# Patient Record
Sex: Male | Born: 1975 | Race: White | Hispanic: No | Marital: Married | Smoking: Never smoker
Health system: Southern US, Community
[De-identification: ages and names within clinical notes are randomized; demographics above are authoritative.]

## PROBLEM LIST (undated history)

## (undated) DIAGNOSIS — T7840XA Allergy, unspecified, initial encounter: Secondary | ICD-10-CM

## (undated) DIAGNOSIS — U071 COVID-19: Secondary | ICD-10-CM

## (undated) DIAGNOSIS — E785 Hyperlipidemia, unspecified: Secondary | ICD-10-CM

## (undated) DIAGNOSIS — K219 Gastro-esophageal reflux disease without esophagitis: Secondary | ICD-10-CM

## (undated) DIAGNOSIS — M1990 Unspecified osteoarthritis, unspecified site: Secondary | ICD-10-CM

## (undated) DIAGNOSIS — N200 Calculus of kidney: Secondary | ICD-10-CM

## (undated) DIAGNOSIS — F419 Anxiety disorder, unspecified: Secondary | ICD-10-CM

## (undated) DIAGNOSIS — I10 Essential (primary) hypertension: Secondary | ICD-10-CM

## (undated) HISTORY — DX: Unspecified osteoarthritis, unspecified site: M19.90

## (undated) HISTORY — PX: ESOPHAGOGASTRODUODENOSCOPY: SHX1529

## (undated) HISTORY — DX: Hyperlipidemia, unspecified: E78.5

## (undated) HISTORY — DX: Calculus of kidney: N20.0

## (undated) HISTORY — DX: Allergy, unspecified, initial encounter: T78.40XA

## (undated) HISTORY — PX: COLONOSCOPY: SHX174

## (undated) HISTORY — PX: SHOULDER SURGERY: SHX246

## (undated) HISTORY — DX: Anxiety disorder, unspecified: F41.9

## (undated) HISTORY — DX: COVID-19: U07.1

## (undated) HISTORY — DX: Gastro-esophageal reflux disease without esophagitis: K21.9

---

## 2005-06-23 ENCOUNTER — Emergency Department: Payer: Self-pay | Admitting: Emergency Medicine

## 2005-06-23 ENCOUNTER — Other Ambulatory Visit: Payer: Self-pay

## 2013-04-13 DIAGNOSIS — C439 Malignant melanoma of skin, unspecified: Secondary | ICD-10-CM

## 2013-04-13 HISTORY — DX: Malignant melanoma of skin, unspecified: C43.9

## 2014-12-18 ENCOUNTER — Ambulatory Visit: Payer: Self-pay | Admitting: Surgery

## 2015-01-03 ENCOUNTER — Ambulatory Visit: Payer: Self-pay | Admitting: Surgery

## 2015-01-03 DIAGNOSIS — I10 Essential (primary) hypertension: Secondary | ICD-10-CM

## 2015-02-17 NOTE — Op Note (Signed)
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   PROCEDURES: Arthroscopic superior labral tear from anterior to posterior repair, arthroscopic subacromial decompression, and mini-open biceps tenodesis of right shoulder.   SURGEON:   Pascal Lux, MD  ANESTHESIA:  General endotracheal with an interscalene block placed preoperative by the anesthesiologist.   FINDINGS: As noted above. The labral tear extended from the 10:30 to the 1 o'clock position. There was mild tendinopathic changes of the biceps tendon. The rotator cuff itself was in excellent condition. There were at most grade 1 chondromalacia changes involving the glenoid. The  articular surface of the humeral head appeared to be in good shape.   COMPLICATIONS:  None.   ESTIMATED BLOOD LOSS: Minimal.   TOTAL FLUIDS: Crystalloid 1000 mL.   TOURNIQUET: None.   DRAINS: None.   CLOSURE:  Staples.   BRIEF CLINICAL NOTE: The patient is a 39 year old male with a several month history of persistent right shoulder pain. His symptoms have persisted despite medications, activity modification, etc.  His history and examination were consistent with impingement/tendinopathy with a probable SLAP tear. SLAP tear was confirmed by MRI scan. He presents at this time for arthroscopy, SLAP repair, decompression, and a probable biceps tenodesis.   PROCEDURE IN DETAIL: The patient underwent placement of an interscalene block in the preoperative holding area. He was then brought into the operating room and lain in the supine position. After adequate general endotracheal intubation and anesthesia were obtained, the patient was repositioned in the beach  chair position using the beach chair positioner.  The right shoulder and upper extremity were prepped with ChloraPrep solution before being draped sterilely. Preoperative antibiotics were administered. The expected portal sites and incision site were injected 0.5% Sensorcaine with epinephrine before the camera was placed in the posterior portal. The glenohumeral joint was thoroughly inspected with the findings as described above. An anterior portal was created using an outside in technique. The labrum was carefully probed, confirming the above noted findings. The frayed margins of the torn portion of the labrum were debrided with a full-radius resector. The exposed glenoid rim was roughened with a liberator and the full radius resector down to bleeding bone. The biceps tendon was released from its labral attachment using the ArthroCare wand. The labrum was repaired using a single BioKnotless anchor placed at approximately the 12 o'clock position through a superolateral portal created using an outside in technique. Subsequent probing of the  repair demonstrated excellent stability. The instruments were removed from the joint after suctioning the excess fluid.   The camera was repositioned through the posterior portal into the subacromial space. A separate lateral portal was created using an outside in technique. The shaver was used to remove   abundant bursal tissues before the ArthroCare wand was used to remove the periosteum off the undersurface of the anterior third of the acromion as well as to recess the coracoacromial ligament. The  decompression was completed by performing an acromioplasty using a 4 mm acromionizer bur. The undersurface of the anterior 3rd of the acromion was removed.  The 3.5 full radius resector was reintroduced to remove any residual bony debris before the ArthroCare wand was inserted to obtain hemostasis. The instruments were removed from the subacromial space after suctioning the excess  fluid.   An approximately 4-5 cm incision was made over the anterolateral aspect of the shoulder, incorporating the superolateral portal site and extending distally in line with the bicipital groove. The incision was carried down through the subcutaneous tissues to expose the deltoid fascia. The raphe between the anterior and middle thirds was developed to provide access into the subacromial space. Additional bursal tissues were debrided before the bicipital groove was identified by palpation. The sheath was opened and the biceps tendon delivered through this defect into the wound. The floor of the bicipital groove was roughened with a curette before a single 2.9 mm Biomet JuggerKnot anchor was inserted. Both sets of sutures were passed through the tendon and tied securely to effect the tenodesis. Several additional number #0 Ethibond interrupted sutures were used to close the bicipital sheath, incorporating the biceps tendon to further reinforce the tenodesis. The rotator cuff was carefully inspected on its bursal surface and found to be intact.   The wound was copiously irrigated with sterile saline solution before the deltoid raphe was reapproximated using 2-0 Vicryl interrupted sutures. The subcutaneous tissues were closed in two layers using 2-0 Vicryl interrupted sutures before the skin was closed using staples. The portal sites also were closed using staples. A sterile bulky dressing was applied to the shoulder before the arm was placed into a shoulder immobilizer. The patient was then awakened, extubated, and returned to the recovery room in satisfactory condition after tolerating the procedure well.     ____________________________ J. Dorien Chihuahua, MD jjp:tr D: 01/03/2015 11:12:05 ET T: 01/03/2015 13:06:22 ET JOB#: [id number]  cc: Pascal Lux, MD, <Dictator> Pascal Lux MD ELECTRONICALLY SIGNED 01/08/2015 13:16

## 2016-04-07 ENCOUNTER — Other Ambulatory Visit: Payer: Self-pay | Admitting: Surgery

## 2016-04-07 DIAGNOSIS — M7541 Impingement syndrome of right shoulder: Secondary | ICD-10-CM

## 2016-04-07 DIAGNOSIS — S43431D Superior glenoid labrum lesion of right shoulder, subsequent encounter: Secondary | ICD-10-CM

## 2016-04-07 DIAGNOSIS — M7501 Adhesive capsulitis of right shoulder: Secondary | ICD-10-CM

## 2016-04-07 DIAGNOSIS — M65811 Other synovitis and tenosynovitis, right shoulder: Secondary | ICD-10-CM

## 2016-04-16 ENCOUNTER — Telehealth: Payer: Self-pay | Admitting: Surgery

## 2016-04-16 NOTE — Telephone Encounter (Signed)
Mailed patient a letter to call centralized scheduling to schedule MRI arthrogram. Phoned patient 3x  and left messages with no response

## 2016-11-14 ENCOUNTER — Other Ambulatory Visit
Admission: RE | Admit: 2016-11-14 | Discharge: 2016-11-14 | Disposition: A | Discharge status: Home / Self Care | Payer: BLUE CROSS/BLUE SHIELD | Source: Ambulatory Visit | Attending: Student | Admitting: Student

## 2016-11-14 DIAGNOSIS — R197 Diarrhea, unspecified: Secondary | ICD-10-CM | POA: Diagnosis not present

## 2016-11-14 LAB — GASTROINTESTINAL PANEL BY PCR, STOOL (REPLACES STOOL CULTURE)
Adenovirus F40/41: NOT DETECTED
Astrovirus: NOT DETECTED
CYCLOSPORA CAYETANENSIS: NOT DETECTED
Campylobacter species: NOT DETECTED
Cryptosporidium: NOT DETECTED
Entamoeba histolytica: NOT DETECTED
Enteroaggregative E coli (EAEC): NOT DETECTED
Enteropathogenic E coli (EPEC): NOT DETECTED
Enterotoxigenic E coli (ETEC): NOT DETECTED
Giardia lamblia: NOT DETECTED
Norovirus GI/GII: NOT DETECTED
Plesimonas shigelloides: NOT DETECTED
Rotavirus A: NOT DETECTED
SALMONELLA SPECIES: NOT DETECTED
SHIGA LIKE TOXIN PRODUCING E COLI (STEC): NOT DETECTED
SHIGELLA/ENTEROINVASIVE E COLI (EIEC): NOT DETECTED
Sapovirus (I, II, IV, and V): NOT DETECTED
VIBRIO CHOLERAE: NOT DETECTED
VIBRIO SPECIES: NOT DETECTED
Yersinia enterocolitica: NOT DETECTED

## 2016-11-14 LAB — C DIFFICILE QUICK SCREEN W PCR REFLEX
C DIFFICILE (CDIFF) INTERP: NOT DETECTED
C DIFFICILE (CDIFF) TOXIN: NEGATIVE
C Diff antigen: NEGATIVE

## 2016-11-17 LAB — PANCREATIC ELASTASE, FECAL: Pancreatic Elastase-1, Stool: >500 ug Elast./g (ref >200)

## 2017-05-04 ENCOUNTER — Other Ambulatory Visit: Payer: Self-pay | Admitting: Student

## 2017-05-04 DIAGNOSIS — R1013 Epigastric pain: Secondary | ICD-10-CM

## 2017-05-04 DIAGNOSIS — R1012 Left upper quadrant pain: Secondary | ICD-10-CM

## 2017-05-06 ENCOUNTER — Other Ambulatory Visit: Payer: Self-pay | Admitting: Student

## 2017-05-06 DIAGNOSIS — D509 Iron deficiency anemia, unspecified: Secondary | ICD-10-CM

## 2017-05-06 DIAGNOSIS — R1013 Epigastric pain: Secondary | ICD-10-CM

## 2017-05-06 DIAGNOSIS — R1012 Left upper quadrant pain: Secondary | ICD-10-CM

## 2017-05-12 ENCOUNTER — Ambulatory Visit
Admission: RE | Admit: 2017-05-12 | Discharge: 2017-05-12 | Disposition: A | Discharge status: Home / Self Care | Payer: BLUE CROSS/BLUE SHIELD | Source: Ambulatory Visit | Attending: Student | Admitting: Student

## 2017-05-12 ENCOUNTER — Ambulatory Visit: Payer: BLUE CROSS/BLUE SHIELD

## 2017-05-12 DIAGNOSIS — R1012 Left upper quadrant pain: Secondary | ICD-10-CM | POA: Insufficient documentation

## 2017-05-12 DIAGNOSIS — R1013 Epigastric pain: Secondary | ICD-10-CM | POA: Insufficient documentation

## 2017-05-12 DIAGNOSIS — D509 Iron deficiency anemia, unspecified: Secondary | ICD-10-CM

## 2017-05-12 DIAGNOSIS — D649 Anemia, unspecified: Secondary | ICD-10-CM | POA: Diagnosis not present

## 2017-05-12 HISTORY — DX: Essential (primary) hypertension: I10

## 2017-05-12 MED ORDER — IOPAMIDOL (ISOVUE-300) INJECTION 61%
100.0000 mL | Freq: Once | INTRAVENOUS | Status: AC | PRN
  Administered 2017-05-12: 100 mL via INTRAVENOUS

## 2017-05-14 ENCOUNTER — Ambulatory Visit: Payer: BLUE CROSS/BLUE SHIELD

## 2017-08-20 IMAGING — CT CT ABD-PELV W/ CM
2 of 5 series · 17 of 46 positions shown, 19 images · IV contrast (iopamidol)
Comparison: None.

CLINICAL DATA: Left upper quadrant pain and burning sensation for 1
year. History of prior gastritis.

EXAM:
CT ABDOMEN AND PELVIS WITH CONTRAST
TECHNIQUE: Multidetector CT imaging of the abdomen and pelvis was performed
using the standard protocol following bolus administration of
intravenous contrast.
CONTRAST:  100mL 7UVAU6-IFF IOPAMIDOL (7UVAU6-IFF) INJECTION 61%

[Series 2: axial soft tissue · axial · 0.73mm/px · z∈[-906,-456]mm · 14 of 102 slices shown, 16 images]
[im 6/102  soft-tissue]
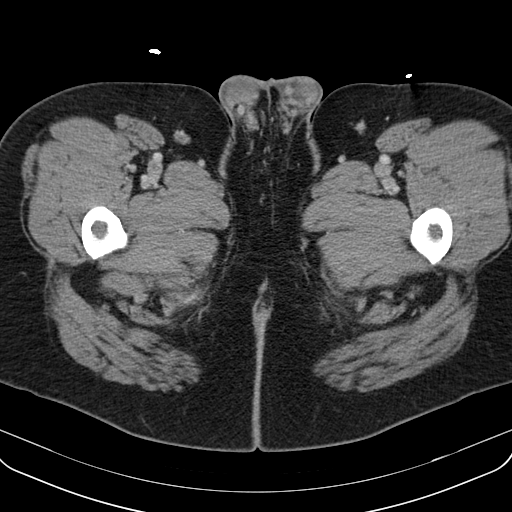
[im 6/102  bone]
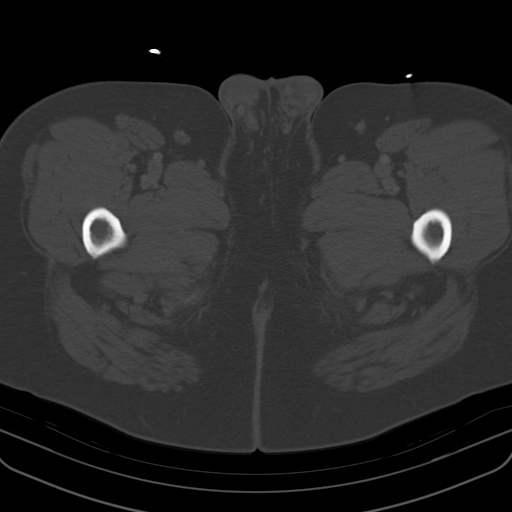
[im 12/102  soft-tissue]
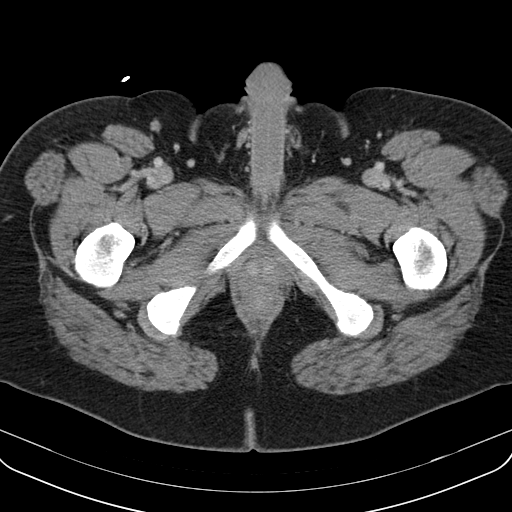
[im 23/102  soft-tissue]
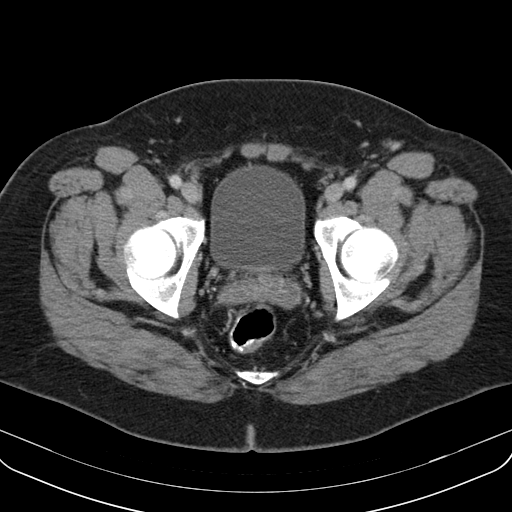
[im 29/102  soft-tissue]
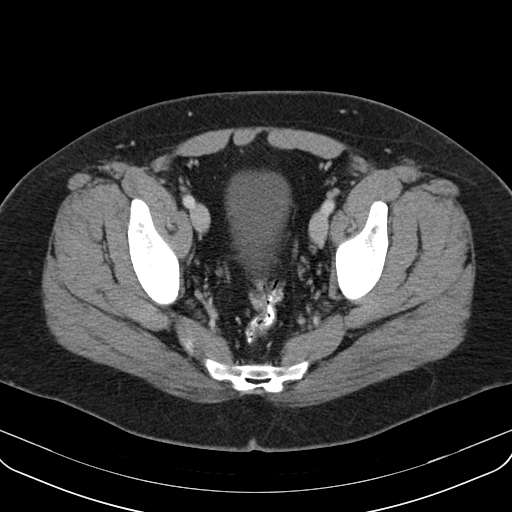
[im 34/102  soft-tissue]
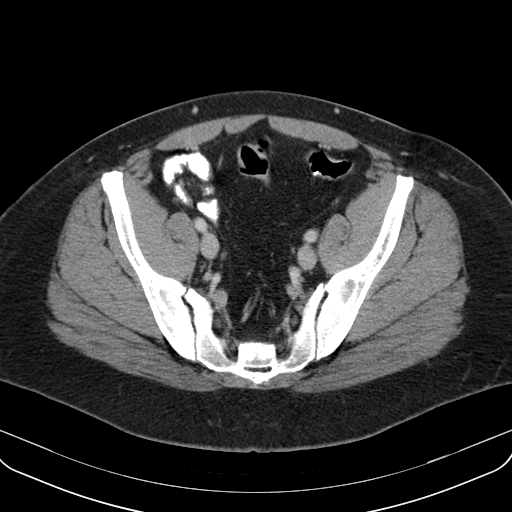
[im 40/102  soft-tissue]
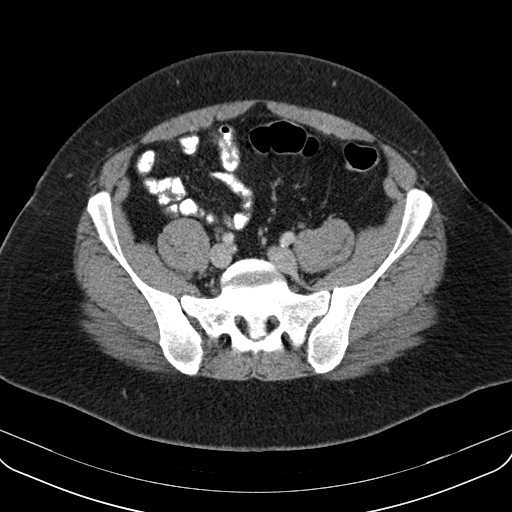
[im 45/102  soft-tissue]
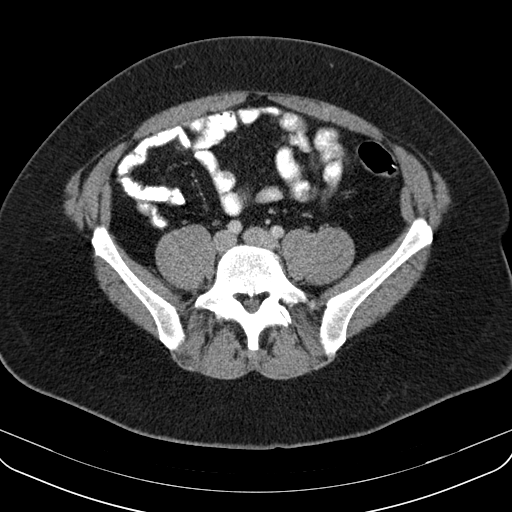
[im 57/102  soft-tissue]
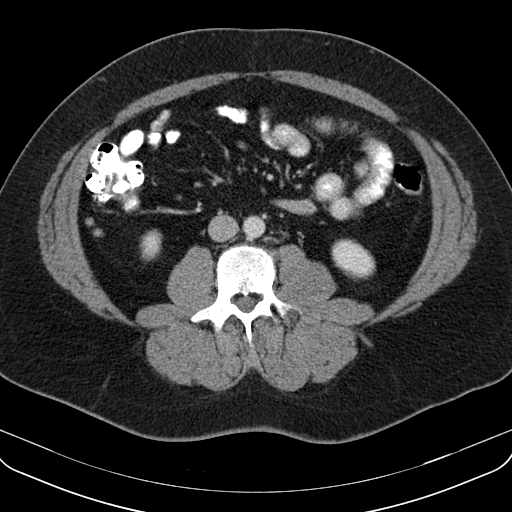
[im 62/102  soft-tissue]
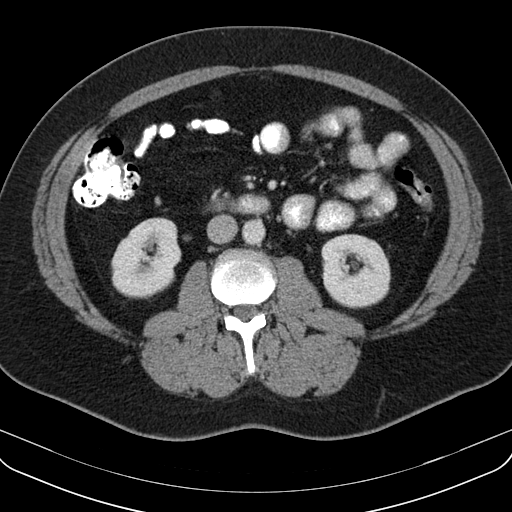
[im 62/102  bone]
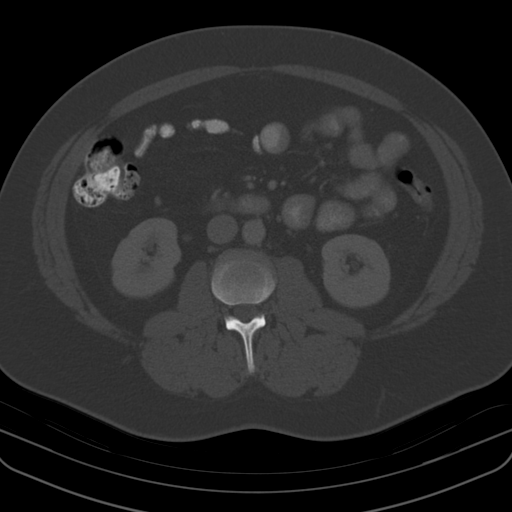
[im 68/102  soft-tissue]
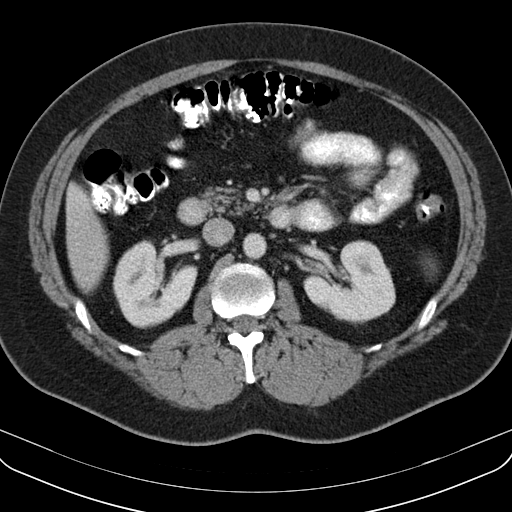
[im 73/102  soft-tissue]
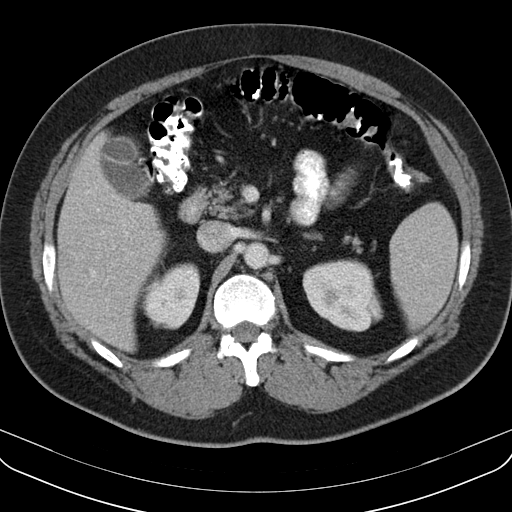
[im 79/102  soft-tissue]
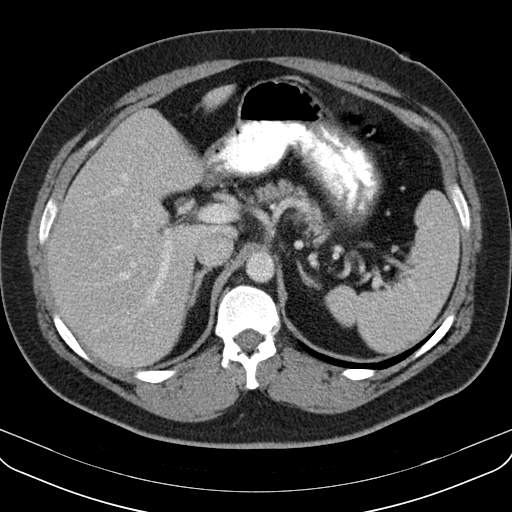
[im 90/102  soft-tissue]
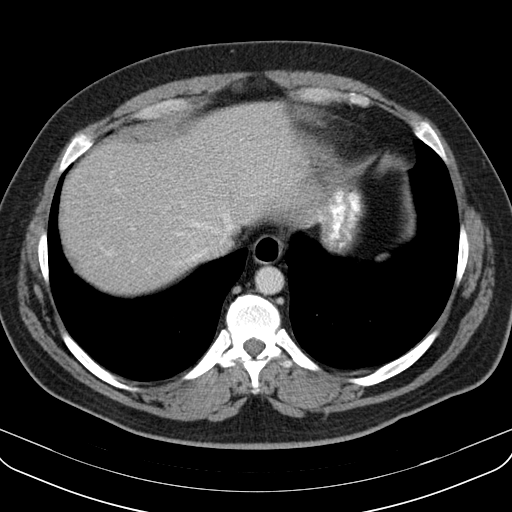
[im 96/102  soft-tissue]
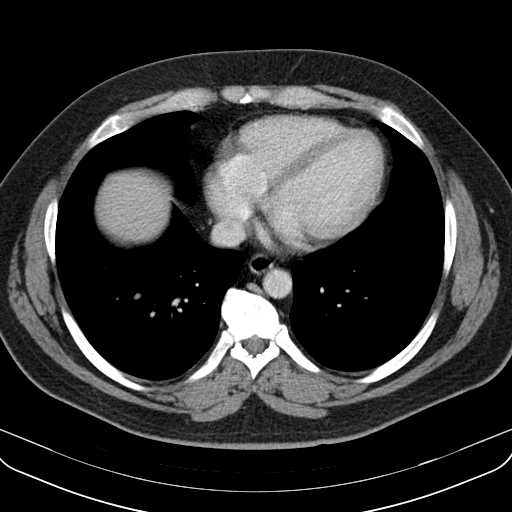

[Series 602: coronal · coronal · 1.00mm/px · 3 of 136 slices shown]
[im 46/136  soft-tissue]
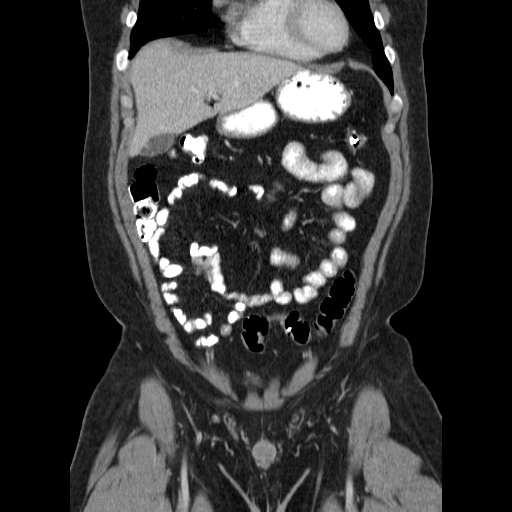
[im 61/136  soft-tissue]
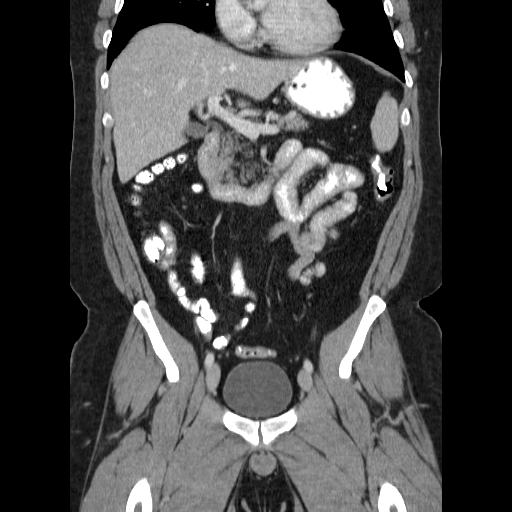
[im 76/136  soft-tissue]
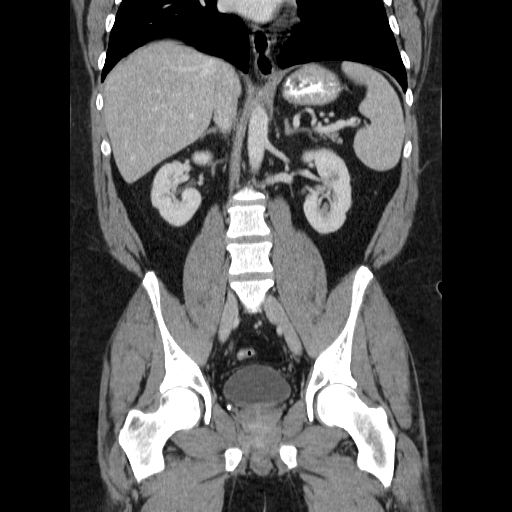

[17 of 46 positions shown; findings below may reference images not displayed]

FINDINGS: Lower chest: The lung bases are clear.  The heart is normal in size.

Hepatobiliary: No focal liver abnormality is seen. No gallstones,
gallbladder wall thickening, or biliary dilatation.

Pancreas: Unremarkable. No pancreatic ductal dilatation or
surrounding inflammatory changes.

Spleen: Normal in size without focal abnormality.

Adrenals/Urinary Tract: Adrenal glands are unremarkable. Kidneys are
normal, without renal calculi, focal lesion, or hydronephrosis.
Bladder is unremarkable.

Stomach/Bowel: Stomach is within normal limits. Appendix appears
normal. No evidence of bowel wall thickening, distention, or
inflammatory changes.

Vascular/Lymphatic: No significant vascular findings are present. No
enlarged abdominal or pelvic lymph nodes.

Reproductive: Prostate is unremarkable.

Other: No abdominal wall hernia or abnormality. No abdominopelvic
ascites.

Musculoskeletal: No acute or significant osseous findings.
IMPRESSION: Normal CT of the abdomen and pelvis. No finding to explain the
patient's symptoms.

## 2019-09-20 ENCOUNTER — Other Ambulatory Visit: Payer: Self-pay

## 2019-09-20 DIAGNOSIS — Z20822 Contact with and (suspected) exposure to covid-19: Secondary | ICD-10-CM

## 2019-09-23 LAB — NOVEL CORONAVIRUS, NAA: SARS-CoV-2, NAA: NOT DETECTED

## 2020-05-30 ENCOUNTER — Other Ambulatory Visit: Payer: Self-pay | Admitting: Physician Assistant

## 2020-05-30 DIAGNOSIS — M5414 Radiculopathy, thoracic region: Secondary | ICD-10-CM

## 2020-06-18 ENCOUNTER — Ambulatory Visit: Payer: BC Managed Care – PPO

## 2020-07-04 ENCOUNTER — Ambulatory Visit: Payer: BC Managed Care – PPO

## 2020-09-30 ENCOUNTER — Other Ambulatory Visit: Payer: Self-pay

## 2020-09-30 ENCOUNTER — Encounter: Payer: Self-pay | Admitting: Dermatology

## 2020-09-30 ENCOUNTER — Ambulatory Visit (INDEPENDENT_AMBULATORY_CARE_PROVIDER_SITE_OTHER): Payer: BC Managed Care – PPO | Admitting: Dermatology

## 2020-09-30 DIAGNOSIS — D1800 Hemangioma unspecified site: Secondary | ICD-10-CM

## 2020-09-30 DIAGNOSIS — D229 Melanocytic nevi, unspecified: Secondary | ICD-10-CM

## 2020-09-30 DIAGNOSIS — Z1283 Encounter for screening for malignant neoplasm of skin: Secondary | ICD-10-CM

## 2020-09-30 DIAGNOSIS — L814 Other melanin hyperpigmentation: Secondary | ICD-10-CM | POA: Diagnosis not present

## 2020-09-30 DIAGNOSIS — L820 Inflamed seborrheic keratosis: Secondary | ICD-10-CM

## 2020-09-30 DIAGNOSIS — D225 Melanocytic nevi of trunk: Secondary | ICD-10-CM | POA: Diagnosis not present

## 2020-09-30 DIAGNOSIS — L821 Other seborrheic keratosis: Secondary | ICD-10-CM | POA: Diagnosis not present

## 2020-09-30 DIAGNOSIS — D485 Neoplasm of uncertain behavior of skin: Secondary | ICD-10-CM

## 2020-09-30 DIAGNOSIS — Z86006 Personal history of melanoma in-situ: Secondary | ICD-10-CM

## 2020-09-30 DIAGNOSIS — L578 Other skin changes due to chronic exposure to nonionizing radiation: Secondary | ICD-10-CM

## 2020-09-30 NOTE — Progress Notes (Signed)
Follow-Up Visit   Subjective  Stephen Cochran is a 44 y.o. 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 The patient presents for Total-Body Skin Exam (TBSE) for skin cancer screening and mole check.  The following portions of the chart were reviewed this encounter and updated as appropriate:   Allergies  Meds  Problems  Med Hx  Surg Hx  Fam Hx     Review of Systems:  No other skin or systemic complaints except as noted in HPI or Assessment and Plan.  Objective  Well appearing patient in no apparent distress; mood and affect are within normal limits.  A full examination was performed including scalp, head, eyes, ears, nose, lips, neck, chest, axillae, abdomen, back, buttocks, bilateral upper extremities, bilateral lower extremities, hands, feet, fingers, toes, fingernails, and toenails. All findings within normal limits unless otherwise noted below.  Objective  L med cheek: 2.0 cm Erythematous keratotic or waxy stuck-on papule or plaque.   Objective  L mid back 8.0 cm lat to spine: 0.8 cm irregular brown macule  Assessment & Plan  Inflamed seborrheic keratosis L med cheek  Destruction of lesion - L med cheek Complexity: simple   Destruction method: cryotherapy   Informed consent: discussed and consent obtained   Timeout:  patient name, date of birth, surgical site, and procedure verified Lesion destroyed using liquid nitrogen: Yes   Region frozen until ice ball extended beyond lesion: Yes   Outcome: patient tolerated procedure well with no complications   Post-procedure details: wound care instructions given    Neoplasm of uncertain behavior of skin L mid back 8.0 cm lat to spine  Epidermal / dermal shaving  Lesion diameter (cm):  0.8 Informed consent: discussed and consent obtained   Timeout: patient name, date of birth, surgical site, and procedure verified   Procedure prep:  Patient was prepped and draped in usual  sterile fashion Prep type:  Isopropyl alcohol Anesthesia: the lesion was anesthetized in a standard fashion   Anesthetic:  1% lidocaine w/ epinephrine 1-100,000 buffered w/ 8.4% NaHCO3 Instrument used: flexible razor blade   Hemostasis achieved with: pressure, aluminum chloride and electrodesiccation   Outcome: patient tolerated procedure well   Post-procedure details: sterile dressing applied and wound care instructions given   Dressing type: bandage and petrolatum    Specimen 1 - Surgical pathology Differential Diagnosis: D48.5 r/o dysplastic nevus  Hx MMIS Check Margins: No 0.8 cm irregular brown macule   Lentigines - Scattered tan macules - Discussed due to sun exposure - Benign, observe - Call for any changes  Seborrheic Keratoses - Stuck-on, waxy, tan-brown papules and plaques  - Discussed benign etiology and prognosis. - Observe - Call for any changes  Melanocytic Nevi - Tan-brown and/or pink-flesh-colored symmetric macules and papules - Benign appearing on exam today - Observation - Call clinic for new or changing moles - Recommend daily use of broad spectrum spf 30+ sunscreen to sun-exposed areas.   Hemangiomas - Red papules - Discussed benign nature - Observe - Call for any changes  Actinic Damage - Chronic, secondary to cumulative UV/sun exposure - diffuse scaly erythematous macules with underlying dyspigmentation - Recommend daily broad spectrum sunscreen SPF 30+ to sun-exposed areas, reapply every 2 hours as needed.  - Call for new or changing lesions.  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  - Call if any new or changing lesions are noted between office visits  Skin cancer screening performed today.  Return in about 3 months  (around 12/29/2020) for recheck ISK .  I, Rudell Cobb, CMA, am acting as scribe for Sarina Ser, MD .  Documentation: I have reviewed the above documentation for accuracy and completeness, and I agree with the above.  Sarina Ser, MD

## 2020-09-30 NOTE — Patient Instructions (Signed)

## 2020-10-02 ENCOUNTER — Encounter: Payer: Self-pay | Admitting: Dermatology

## 2020-10-02 ENCOUNTER — Telehealth: Payer: Self-pay

## 2020-10-02 NOTE — Telephone Encounter (Signed)
-----   Message from Ralene Bathe, MD sent at 10/01/2020  7:10 PM EST ----- Diagnosis Skin , left mid back 0.8 cm lat to spine MELANOCYTIC NEVUS, INTRADERMAL TYPE  Benign mole No further treatment needed

## 2020-10-02 NOTE — Telephone Encounter (Signed)
Patient informed of pathology results 

## 2021-01-13 ENCOUNTER — Ambulatory Visit: Payer: BC Managed Care – PPO | Admitting: Dermatology

## 2021-02-26 ENCOUNTER — Ambulatory Visit: Payer: BC Managed Care – PPO | Admitting: Dermatology

## 2021-02-27 ENCOUNTER — Ambulatory Visit: Payer: BC Managed Care – PPO | Admitting: Dermatology

## 2021-04-24 ENCOUNTER — Telehealth: Payer: Self-pay | Admitting: Psychiatry

## 2021-04-28 ENCOUNTER — Ambulatory Visit: Payer: Self-pay | Admitting: Psychiatry

## 2021-07-16 ENCOUNTER — Other Ambulatory Visit: Payer: Self-pay

## 2021-07-16 ENCOUNTER — Encounter: Payer: Self-pay | Admitting: Emergency Medicine

## 2021-07-16 ENCOUNTER — Ambulatory Visit
Admission: EM | Admit: 2021-07-16 | Discharge: 2021-07-16 | Disposition: A | Discharge status: Home / Self Care | Payer: BC Managed Care – PPO | Attending: Physician Assistant | Admitting: Physician Assistant

## 2021-07-16 DIAGNOSIS — I10 Essential (primary) hypertension: Secondary | ICD-10-CM | POA: Diagnosis not present

## 2021-07-16 DIAGNOSIS — B353 Tinea pedis: Secondary | ICD-10-CM | POA: Diagnosis not present

## 2021-07-16 DIAGNOSIS — L089 Local infection of the skin and subcutaneous tissue, unspecified: Secondary | ICD-10-CM | POA: Diagnosis not present

## 2021-07-16 MED ORDER — CICLOPIROX OLAMINE 0.77 % EX CREA: TOPICAL_CREAM | Freq: Two times a day (BID) | CUTANEOUS | 0 refills | Status: DC

## 2021-07-16 MED ORDER — CEPHALEXIN 500 MG PO CAPS: 500.0000 mg | ORAL_CAPSULE | Freq: Three times a day (TID) | ORAL | 0 refills | Status: DC

## 2021-07-16 NOTE — ED Provider Notes (Signed)
[facility]    CSN: [id number] Arrival date & time: 07/16/21  1000      History   Chief Complaint Chief Complaint  Patient presents with   Foot Pain    HPI Stephen Cochran is a 45 y.o. male.   Patient presents today with a 1 month history of painful rash on right foot. He reports symptoms began as a pruritic and irritating rash. He treated this with numerous over the counter medications for athlete's foot that improved rash but did not resolve it. Reports increasing pain with persistent rash between right second and third toes. Pain is rated 7 on a 0-10 pain scale, described as burning with periodic sharp pains, worse with working and being on his feet all day at work. He has tried multiple medications without improvement of symptoms. Denies history of diabetes. He has developed fever, and chills but denies additional symptoms. Denies any recent antibiotic use. Denies history of dermatological condition. Has not seen anyone about this condition. He is having trouble with daily activities as a result of symptoms.   Blood pressure was noted to be elevated today. Patient believes this is related to pain. He is taking medication as prescribed. Denies any headache, chest pain, shortness of breath, vision changes, dizziness.    Past Medical History:  Diagnosis Date   Hypertension    Melanoma ([place]) 04/13/2013   L mid upper back - excision     There are no problems to display for this patient.   Past Surgical History:  Procedure Laterality Date   SHOULDER SURGERY         Home Medications    Prior to Admission medications   Medication Sig Start Date End Date Taking? Authorizing Provider  cephALEXin (KEFLEX) 500 MG capsule Take 1 capsule (500 mg total) by mouth 3 (three) times daily. 07/16/21  Yes Aunisty Reali K, PA-C  ciclopirox (LOPROX) 0.77 % cream Apply topically 2 (two) times daily. 07/16/21  Yes Glendell Fouse K, PA-C  atenolol (TENORMIN) 100 MG tablet Take  by mouth. 03/23/19   [provider]  lamoTRIgine (LAMICTAL) 100 MG tablet Take 100 mg by mouth daily. 09/25/20   [provider]  losartan (COZAAR) 100 MG tablet Take 100 mg by mouth daily. 07/17/20   [provider]  pantoprazole (PROTONIX) 40 MG tablet Take 1 tablet by mouth daily. 05/28/20   [provider]    Family History History reviewed. No pertinent family history.  Social History Social History   Tobacco Use   Smoking status: Never   Smokeless tobacco: Current  Vaping Use   Vaping Use: Never used  Substance Use Topics   Alcohol use: Never   Drug use: Never     Allergies   Patient has no known allergies.   Review of Systems Review of Systems  Constitutional:  Positive for activity change, chills and fever. Negative for appetite change and fatigue.  Respiratory:  Negative for cough and shortness of breath.   Gastrointestinal:  Negative for abdominal pain, diarrhea, nausea and vomiting.  Musculoskeletal:  Negative for arthralgias and myalgias.  Skin:  Positive for color change, rash and wound.  Neurological:  Negative for dizziness, weakness, light-headedness, numbness and headaches.    Physical Exam Triage Vital Signs ED Triage Vitals  Enc Vitals Group     BP 07/16/21 1010 (!) 160/87     Pulse Rate 07/16/21 1010 71     Resp 07/16/21 1010 18     Temp 07/16/21  1010 98.2 F (36.8 C)     Temp Source 07/16/21 1010 Oral     SpO2 07/16/21 1010 98 %     Weight --      Height --      Head Circumference --      Peak Flow --      Pain Score 07/16/21 1018 7     Pain Loc --      Pain Edu? --      Excl. in [place]? --    No data found.  Updated Vital Signs BP (!) 160/87 (BP Location: Left Arm)   Pulse 71   Temp 98.2 F (36.8 C) (Oral)   Resp 18   SpO2 98%   Visual Acuity Right Eye Distance:   Left Eye Distance:   Bilateral Distance:    Right Eye Near:   Left Eye Near:    Bilateral Near:     Physical Exam Vitals  reviewed.  Constitutional:      General: He is awake.     Appearance: Normal appearance. He is well-developed. He is not ill-appearing.     Comments: Very pleasant male appears stated age in no acute distress  HENT:     Head: Normocephalic and atraumatic.     Mouth/Throat:     Pharynx: No oropharyngeal exudate, posterior oropharyngeal erythema or uvula swelling.  Cardiovascular:     Rate and Rhythm: Normal rate and regular rhythm.     Pulses:          Posterior tibial pulses are 2+ on the right side and 2+ on the left side.     Heart sounds: Normal heart sounds, S1 normal and S2 normal. No murmur heard.    Comments: Capillary refill within 2 seconds right foot Pulmonary:     Effort: Pulmonary effort is normal.     Breath sounds: Normal breath sounds. No stridor. No wheezing, rhonchi or rales.     Comments: Clear to auscultation bilaterally Abdominal:     General: Bowel sounds are normal.     Palpations: Abdomen is soft.     Tenderness: There is no abdominal tenderness.  Feet:     Right foot:     Protective Sensation: 10 sites tested.  10 sites sensed.  Skin:    Findings: Rash present. Rash is macular and papular.     Comments: Maculopapular rash with associated laceration and fissures noted at 2/3 Toes.  Serosanguineous drainage noted.  No purulent drainage noted.  No streaking or evidence of lymphangitis.  Neurological:     Mental Status: He is alert.  Psychiatric:        Behavior: Behavior is cooperative.     UC Treatments / Results  Labs (all labs ordered are listed, but only abnormal results are displayed) Labs Reviewed - No data to display  EKG   Radiology No results found.  Procedures Procedures (including critical care time)  Medications Ordered in UC Medications - No data to display  Initial Impression / Assessment and Plan / UC Course  I have reviewed the triage vital signs and the nursing notes.  Pertinent labs & imaging results that were available  during my care of the patient were reviewed by me and considered in my medical decision making (see chart for details).      Symptoms consistent with tinea pedis but concern given patient has not had improvement of symptoms despite consistent use of over-the-counter antifungal medications for over a week.  I suspect this secondary  infection and given associated fever/chills and worsening symptoms.  We will start antibiotics (Keflex 500 mg for 7 days) as well as use Loprox to treat underlying tinea pedis.  Patient was encouraged to keep area clean and dry.  He can use over-the-counter medications such as Tylenol for pain relief.  Discussed alarm symptoms that warrant emergent evaluation.  He was given contact information for local podiatrist and encouraged to follow-up with them if symptoms or not improving.  Strict return precautions given to which he expressed understanding.  Blood pressure was noted to be elevated today.  Patient denies any signs/symptoms of endorgan damage.  He was encouraged to monitor his blood pressure at home and keep log for evaluation of follow-up appointment.  If this is persistently above 140/90 he needs to be reevaluated.  Discussed that if he has any chest pain, shortness of breath, headache, vision changes, dizziness in setting of high blood pressure he needs to go to emergency room.  Recommended he follow-up with primary care provider in a week to ensure symptom improvement.  Final Clinical Impressions(s) / UC Diagnoses   Final diagnoses:  Skin infection  Tinea pedis of right foot  Elevated blood pressure reading in office with diagnosis of hypertension     Discharge Instructions      Take antibiotics as prescribed.  Use Loprox twice daily for minimum of 2 weeks.  Keep area clean and dry.  If you have persistent symptoms please follow-up with podiatrist.  If anything worsens please return for reevaluation.  Blood pressure was elevated today.  Please continue  monitoring this.  If this remains elevated or if you develop any symptoms including headache, chest pain, shortness of breath in the setting of high blood pressure you need to be seen immediately.  Follow-up with PCP within a week to ensure symptom improvement.     ED Prescriptions     Medication Sig Dispense Auth. Provider   cephALEXin (KEFLEX) 500 MG capsule Take 1 capsule (500 mg total) by mouth 3 (three) times daily. 21 capsule Taven Strite K, PA-C   ciclopirox (LOPROX) 0.77 % cream Apply topically 2 (two) times daily. 60 g Shemuel Harkleroad, Derry Skill, PA-C      PDMP not reviewed this encounter.   Terrilee Croak, PA-C 07/16/21 1048

## 2021-07-16 NOTE — ED Triage Notes (Addendum)
Complains of right foot pain.  Reports athletes foot issues for a month and has used otc medicines and no improvement.  Patient reports this is getting much worse, painful.  Patient spends long shifts at work in work boots.  Raw and irritated skin between middle and toe next to great toe

## 2021-07-16 NOTE — Discharge Instructions (Signed)
Take antibiotics as prescribed.  Use Loprox twice daily for minimum of 2 weeks.  Keep area clean and dry.  If you have persistent symptoms please follow-up with podiatrist.  If anything worsens please return for reevaluation.  Blood pressure was elevated today.  Please continue monitoring this.  If this remains elevated or if you develop any symptoms including headache, chest pain, shortness of breath in the setting of high blood pressure you need to be seen immediately.  Follow-up with PCP within a week to ensure symptom improvement.

## 2021-08-21 ENCOUNTER — Ambulatory Visit (INDEPENDENT_AMBULATORY_CARE_PROVIDER_SITE_OTHER): Payer: BC Managed Care – PPO | Admitting: Pulmonary Disease

## 2021-08-21 ENCOUNTER — Encounter: Payer: Self-pay | Admitting: Pulmonary Disease

## 2021-08-21 ENCOUNTER — Other Ambulatory Visit: Payer: Self-pay

## 2021-08-21 VITALS — BP 100/60 | HR 61 | Temp 98.4°F | Ht 60.0 in | Wt 231.6 lb

## 2021-08-21 DIAGNOSIS — J4550 Severe persistent asthma, uncomplicated: Secondary | ICD-10-CM | POA: Diagnosis not present

## 2021-08-21 DIAGNOSIS — R0602 Shortness of breath: Secondary | ICD-10-CM

## 2021-08-21 DIAGNOSIS — Z8616 Personal history of COVID-19: Secondary | ICD-10-CM

## 2021-08-21 MED ORDER — ARNUITY ELLIPTA 100 MCG/ACT IN AEPB: 1.0000 | INHALATION_SPRAY | Freq: Every day | RESPIRATORY_TRACT | 5 refills | Status: DC

## 2021-08-21 MED ORDER — ALBUTEROL SULFATE HFA 108 (90 BASE) MCG/ACT IN AERS: 2.0000 | INHALATION_SPRAY | Freq: Four times a day (QID) | RESPIRATORY_TRACT | 5 refills | Status: AC | PRN

## 2021-08-21 NOTE — Patient Instructions (Signed)
Arnuity one puff daily, and rinse mouth after each use  Albuterol two puffs every 6 hours as needed for cough, wheeze, chest congestion, or shortness of breath  Will get [facility] of medical records from [facility]  Will arrange for pulmonary function test, echocardiogram, and overnight oxygen test  Follow up in 4 to 6 weeks with Dr. Halford Chessman or Nurse Practitioner

## 2021-08-21 NOTE — Progress Notes (Signed)
[facility]  Chief Complaint  Patient presents with   Consult    Post Covid 19-  dyspnea.    Past Surgical History:  He  has a past surgical history that includes Shoulder surgery; Colonoscopy; and Esophagogastroduodenoscopy.  Past Medical History:  HTN, Melanoma 2014, Allergies, Anxiety, OA, GERD, HLD, Nephrolithiasis, COVID 19   Constitutional:  BP 100/60 (BP Location: Left Arm, Patient Position: Sitting, Cuff Size: Normal)   Pulse 61   Temp 98.4 F (36.9 C) (Oral)   Ht 5' (1.524 m)   Wt 231 lb 9.6 oz (105.1 kg)   SpO2 100%   BMI 45.23 kg/m   Brief Summary:  Stephen Cochran is a 45 y.o. male with dyspnea.      Subjective:   He is here with his wife who is a Marine scientist.  He started feeling ill on 06/23/21.  He developed fever on 06/24/21, and tested positive for COVID 19.  He was treated with paxlovid.  Since then he has noticed difficulty with his breathing.  He will get a dry cough and wheeze.  When he feels short of breath he gets anxious and then light-headed.  He has trouble will allergies, especially in the Spring.  He intermittently gets wheezing in his chest, but was never diagnosed with asthma.  No history of food or medication allergies.  He never smoked.  He thinks he had pneumonia several years ago, but wasn't formally diagnosed with this.  He denies eczema or hives.  He works as a Clinical cytogeneticist for [employer], and his work is very strenuous.  He was having difficulty keeping up with work requirements after he had COVID, and his PCP put him on duty restriction.  He has not had leg swelling, or hemoptysis.  He intermittently feels tight in his chest.  He maintained SpO2 in 90s on room air while walking today, but had to stop after walking two laps due to feeling short of breath.  Physical Exam:   Appearance - well kempt   ENMT - no sinus tenderness, no oral exudate, no LAN, Mallampati 3 airway, no stridor, deviated  septum  Respiratory - equal breath sounds bilaterally, no wheezing or rales  CV - s1s2 regular rate and rhythm, no murmurs  Ext - no clubbing, no edema  Skin - no rashes  Psych - normal mood and affect  ECG today showed sinus bradycardia with heart rate of 59.   Pulmonary testing:    Chest Imaging:    Sleep Tests:    Cardiac Tests:    Social History:  He  reports that he has never smoked. He uses smokeless tobacco. He reports that he does not drink alcohol and does not use drugs.  Family History:  His family history includes Cancer in his mother; Colon cancer in his father; Coronary artery disease in his father; Hypertension in his father.    Discussion:  He likely has asthma in the setting of allergic rhinitis.  He has developed worsening asthma like symptoms since being diagnosed with COVID 19 infection in September 2022.  He also reports variable blood pressure, light-headedness, and dizzy spells.  He is also experiencing brain fog and fatigue.  His sense of smell and taste are slowly returning.  Assessment/Plan:   Dyspnea. - will have him try arnuity 100 one puff daily and prn albuterol - will get copy of recent lab tests and chest xray from his PCP office - will arrange for pulmonary function  test, echocardiogram, and overnight oximetry - it is my recommendation that he remain on restricted work duty until his response to inhaler therapy can be assessed and above test results reviewed  Time Spent Involved in Patient Care on Day of Examination:  65 minutes  Follow up:   Patient Instructions  Arnuity one puff daily, and rinse mouth after each use  Albuterol two puffs every 6 hours as needed for cough, wheeze, chest congestion, or shortness of breath  Will get copy of medical records from [facility]  Will arrange for pulmonary function test, echocardiogram, and overnight oxygen test  Follow up in 4 to 6 weeks with Dr. Halford Chessman or Nurse  Practitioner  Medication List:   Allergies as of 08/21/2021   No Known Allergies      Medication List        Accurate as of August 21, 2021 10:37 AM. If you have any questions, ask your nurse or doctor.          STOP taking these medications    cephALEXin 500 MG capsule Commonly known as: KEFLEX Stopped by: Chesley Mires, MD       TAKE these medications    albuterol 108 (90 Base) MCG/ACT inhaler Commonly known as: VENTOLIN HFA Inhale 2 puffs into the lungs every 6 (six) hours as needed for wheezing or shortness of breath. Started by: Chesley Mires, MD   Arnuity Ellipta 100 MCG/ACT Aepb Generic drug: Fluticasone Furoate Inhale 1 puff into the lungs daily. Started by: Chesley Mires, MD   atenolol 100 MG tablet Commonly known as: TENORMIN Take by mouth.   ciclopirox 0.77 % cream Commonly known as: LOPROX Apply topically 2 (two) times daily. What changed: Another medication with the same name was removed. Continue taking this medication, and follow the directions you see here. Changed by: Chesley Mires, MD   lamoTRIgine 100 MG tablet Commonly known as: LAMICTAL Take 100 mg by mouth daily.   losartan 100 MG tablet Commonly known as: COZAAR Take 100 mg by mouth daily.   pantoprazole 40 MG tablet Commonly known as: PROTONIX Take 1 tablet by mouth daily.        Signature:  Chesley Mires, MD [place] Pager - [phone] 08/21/2021, 10:37 AM

## 2021-08-26 ENCOUNTER — Other Ambulatory Visit: Payer: Self-pay

## 2021-08-26 ENCOUNTER — Other Ambulatory Visit
Admission: RE | Admit: 2021-08-26 | Discharge: 2021-08-26 | Disposition: A | Discharge status: Home / Self Care | Payer: BC Managed Care – PPO | Source: Ambulatory Visit | Attending: Pulmonary Disease | Admitting: Pulmonary Disease

## 2021-08-26 DIAGNOSIS — Z01812 Encounter for preprocedural laboratory examination: Secondary | ICD-10-CM | POA: Diagnosis not present

## 2021-08-26 DIAGNOSIS — Z20822 Contact with and (suspected) exposure to covid-19: Secondary | ICD-10-CM | POA: Diagnosis not present

## 2021-08-26 LAB — SARS CORONAVIRUS 2 (TAT 6-24 HRS): SARS Coronavirus 2: NEGATIVE

## 2021-08-27 ENCOUNTER — Ambulatory Visit (HOSPITAL_BASED_OUTPATIENT_CLINIC_OR_DEPARTMENT_OTHER)
Admission: RE | Admit: 2021-08-27 | Discharge: 2021-08-27 | Disposition: A | Discharge status: Home / Self Care | Payer: BC Managed Care – PPO | Source: Ambulatory Visit | Attending: Pulmonary Disease | Admitting: Pulmonary Disease

## 2021-08-27 ENCOUNTER — Other Ambulatory Visit: Payer: Self-pay

## 2021-08-27 ENCOUNTER — Ambulatory Visit: Payer: BC Managed Care – PPO

## 2021-08-27 ENCOUNTER — Emergency Department
Admission: EM | Admit: 2021-08-27 | Discharge: 2021-08-27 | Disposition: A | Discharge status: Home / Self Care | Payer: BC Managed Care – PPO | Attending: Emergency Medicine | Admitting: Emergency Medicine

## 2021-08-27 DIAGNOSIS — R0600 Dyspnea, unspecified: Secondary | ICD-10-CM | POA: Insufficient documentation

## 2021-08-27 DIAGNOSIS — R531 Weakness: Secondary | ICD-10-CM | POA: Insufficient documentation

## 2021-08-27 DIAGNOSIS — J4550 Severe persistent asthma, uncomplicated: Secondary | ICD-10-CM | POA: Insufficient documentation

## 2021-08-27 DIAGNOSIS — R002 Palpitations: Secondary | ICD-10-CM | POA: Diagnosis present

## 2021-08-27 DIAGNOSIS — Z85820 Personal history of malignant melanoma of skin: Secondary | ICD-10-CM | POA: Diagnosis not present

## 2021-08-27 DIAGNOSIS — F17220 Nicotine dependence, chewing tobacco, uncomplicated: Secondary | ICD-10-CM | POA: Diagnosis not present

## 2021-08-27 DIAGNOSIS — I10 Essential (primary) hypertension: Secondary | ICD-10-CM | POA: Diagnosis not present

## 2021-08-27 DIAGNOSIS — E785 Hyperlipidemia, unspecified: Secondary | ICD-10-CM | POA: Diagnosis not present

## 2021-08-27 DIAGNOSIS — R0602 Shortness of breath: Secondary | ICD-10-CM

## 2021-08-27 DIAGNOSIS — Z79899 Other long term (current) drug therapy: Secondary | ICD-10-CM | POA: Insufficient documentation

## 2021-08-27 DIAGNOSIS — Z8616 Personal history of COVID-19: Secondary | ICD-10-CM

## 2021-08-27 LAB — TROPONIN I (HIGH SENSITIVITY)
Troponin I (High Sensitivity): 3 ng/L (ref <18)
Troponin I (High Sensitivity): 3 ng/L (ref <18)

## 2021-08-27 LAB — BASIC METABOLIC PANEL
Anion gap: 8 (ref 5–15)
BUN: 14 mg/dL (ref 6–20)
CO2: 28 mmol/L (ref 22–32)
Calcium: 9.6 mg/dL (ref 8.9–10.3)
Chloride: 101 mmol/L (ref 98–111)
Creatinine, Ser: 0.95 mg/dL (ref 0.61–1.24)
GFR, Estimated: >60 mL/min (ref >60)
Glucose, Bld: 111 mg/dL — ABNORMAL HIGH (ref 70–99)
Potassium: 4.5 mmol/L (ref 3.5–5.1)
Sodium: 137 mmol/L (ref 135–145)

## 2021-08-27 LAB — CBC
HCT: 43 % (ref 39.0–52.0)
Hemoglobin: 14.3 g/dL (ref 13.0–17.0)
MCH: 28.8 pg (ref 26.0–34.0)
MCHC: 33.3 g/dL (ref 30.0–36.0)
MCV: 86.7 fL (ref 80.0–100.0)
Platelets: 307 10*3/uL (ref 150–400)
RBC: 4.96 MIL/uL (ref 4.22–5.81)
RDW: 13.6 % (ref 11.5–15.5)
WBC: 10.2 10*3/uL (ref 4.0–10.5)
nRBC: 0 % (ref 0.0–0.2)

## 2021-08-27 LAB — ECHOCARDIOGRAM COMPLETE
AR max vel: 2.75 cm2
AV Area VTI: 2.95 cm2
AV Area mean vel: 2.6 cm2
AV Mean grad: 3 mmHg
AV Peak grad: 4.8 mmHg
Ao pk vel: 1.1 m/s
Area-P 1/2: 4.19 cm2
MV VTI: 2 cm2
S' Lateral: 3.11 cm

## 2021-08-27 MED ORDER — ACETAMINOPHEN 500 MG PO TABS
1000.0000 mg | ORAL_TABLET | Freq: Once | ORAL | Status: AC
  Administered 2021-08-27: 1000 mg via ORAL
  Filled 2021-08-27: qty 2

## 2021-08-27 NOTE — ED Provider Notes (Signed)
[facility] Emergency Department Provider Note  Time seen: 11:52 AM  I have reviewed the triage vital signs and the nursing notes.   HISTORY  Chief Complaint Palpitations   HPI Stephen Cochran is a 45 y.o. male with a past medical history of anxiety, gastric reflux, hypertension, hyperlipidemia, presents to the emergency department for palpitations.  According to the patient in September he was diagnosed with COVID-19.  Since that time he has had significant episodes of dizziness weakness and shortness of breath that has been ongoing over the past 2 months.  Patient was referred to pulmonology for the symptoms, today during an echocardiogram he appeared to have heart rate ranging between 50 and 200 bpm.  There were concerned so they sent the patient to the emergency department for evaluation.  Here the patient states mild dizziness at times but denies any shortness of breath.  Patient denies any chest pain at any point.  Patient's heart rate appears to be in a normal sinus rhythm around 60 bpm.   Past Medical History:  Diagnosis Date   Allergies    Anxiety    Arthritis    COVID-19    GERD (gastroesophageal reflux disease)    Hyperlipidemia    Hypertension    Melanoma ([place]) 04/13/2013   L mid upper back - excision    Nephrolithiasis     There are no problems to display for this patient.   Past Surgical History:  Procedure Laterality Date   COLONOSCOPY     ESOPHAGOGASTRODUODENOSCOPY     SHOULDER SURGERY      Prior to Admission medications   Medication Sig Start Date End Date Taking? Authorizing Provider  albuterol (VENTOLIN HFA) 108 (90 Base) MCG/ACT inhaler Inhale 2 puffs into the lungs every 6 (six) hours as needed for wheezing or shortness of breath. 08/21/21   Chesley Mires, MD  atenolol (TENORMIN) 100 MG tablet Take by mouth. 03/23/19   [provider]  ciclopirox (LOPROX) 0.77 % cream Apply topically 2 (two) times daily.    [provider]  Fluticasone Furoate (ARNUITY ELLIPTA) 100 MCG/ACT AEPB Inhale 1 puff into the lungs daily. 08/21/21   Chesley Mires, MD  lamoTRIgine (LAMICTAL) 100 MG tablet Take 100 mg by mouth daily. 09/25/20   [provider]  losartan (COZAAR) 100 MG tablet Take 100 mg by mouth daily. 07/17/20   [provider]  pantoprazole (PROTONIX) 40 MG tablet Take 1 tablet by mouth daily. 05/28/20   [provider]    No Known Allergies  Family History  Problem Relation Age of Onset   Cancer Mother    Hypertension Father    Colon cancer Father    Coronary artery disease Father     Social History Social History   Tobacco Use   Smoking status: Never   Smokeless tobacco: Current  Vaping Use   Vaping Use: Never used  Substance Use Topics   Alcohol use: Never   Drug use: Never    Review of Systems Constitutional: Negative for fever.  Intermittent dizziness/weakness. Cardiovascular: Negative for chest pain.  Positive for palpitations, heart rate as high as 200 earlier today during echocardiogram Respiratory: Intermittent episodes of shortness of breath.  States that at times have occurred multiple times per day lasting 10 to 15 minutes, other times it will be several days in between symptoms. Gastrointestinal: Negative for abdominal pain, vomiting Genitourinary: Negative for urinary compaints Musculoskeletal: Negative for musculoskeletal complaints Neurological: Negative for headache All other  ROS negative  ____________________________________________   PHYSICAL EXAM:  VITAL SIGNS: ED Triage Vitals  Enc Vitals Group     BP 08/27/21 1113 (!) 150/60     Pulse Rate 08/27/21 1113 (!) 58     Resp 08/27/21 1113 20     Temp 08/27/21 1146 97.9 F (36.6 C)     Temp src --      SpO2 08/27/21 1113 100 %     Weight 08/27/21 1111 233 lb 11 oz (106 kg)     Height 08/27/21 1111 5' (1.524 m)     Head Circumference --      Peak Flow --      Pain Score 08/27/21 1111  0     Pain Loc --      Pain Edu? --      Excl. in [place]? --     Constitutional: Alert and oriented. Well appearing and in no distress. Eyes: Normal exam ENT      Head: Normocephalic and atraumatic.      Mouth/Throat: Mucous membranes are moist. Cardiovascular: Normal rate, regular rhythm.  Respiratory: Normal respiratory effort without tachypnea nor retractions. Breath sounds are clear without wheeze rales or rhonchi Gastrointestinal: Soft and nontender. No distention.   Musculoskeletal: Nontender with normal range of motion in all extremities.  Neurologic:  Normal speech and language. No gross focal neurologic deficits Skin:  Skin is warm, dry and intact.  Psychiatric: Mood and affect are normal.   ____________________________________________    EKG  EKG viewed and interpreted by myself shows a normal sinus rhythm at 56 bpm with a narrow QRS, normal axis, normal intervals, no concerning ST changes.  ____________________________________________    INITIAL IMPRESSION / ASSESSMENT AND PLAN / ED COURSE  Pertinent labs & imaging results that were available during my care of the patient were reviewed by me and considered in my medical decision making (see chart for details).   Patient presents emergency department for intermittent episodes of dizziness weakness shortness of breath ongoing since he was diagnosed with COVID in September.  Patient was seen by pulmonology earlier today had an echocardiogram in which his heart rate ranged from 50 to 200 bpm.  They were concerned so they sent patient to the emergency department for evaluation.  Here the patient appears well, no chest pain at any point.  No current shortness of breath.  Does state intermittent dizziness.  Heart rate appears to be around 60 bpm normal sinus rhythm we will continue to watch on the cardiac monitor and obtain lab work including cardiac enzymes.  Patient agreeable plan of care.  Patient's work-up is reassuring.   Negative troponin x2.  Reassuring EKG.  Patient has been on a cardiac monitor since arrival to the emergency department with reassuring rhythm strips.  No arrhythmias noted.  We will discharge her cardiology follow-up for likely Holter monitoring.  Stephen Cochran was evaluated in Emergency Department on 08/27/2021 for the symptoms described in the history of present illness. He was evaluated in the context of the global COVID-19 pandemic, which necessitated consideration that the patient might be at risk for infection with the SARS-CoV-2 virus that causes COVID-19. Institutional protocols and algorithms that pertain to the evaluation of patients at risk for COVID-19 are in a state of rapid change based on information released by regulatory bodies including the CDC and federal and state organizations. These policies and algorithms were followed during the patient's care in the ED.  ____________________________________________   FINAL CLINICAL IMPRESSION(S) /  ED DIAGNOSES  Palpitations weakness   Harvest Dark, MD 08/27/21 1435

## 2021-08-27 NOTE — Progress Notes (Signed)
*  PRELIMINARY RESULTS* Echocardiogram 2D Echocardiogram has been performed.  Stephen Cochran 08/27/2021, 10:47 AM

## 2021-08-27 NOTE — ED Notes (Addendum)
Pt having dizziness, generalized weakness, lethargy and SOB. SOB and generalized weakness/dizziness since early September when pt had Covid. Pt has also had headaches since September and worsened yesterday. Pt having dry cough, esp. at night. Daughter sick with Covid recently. Hx: HTN on Atenolol and Losartan- reports has been difficult to manage sometimes and runs high at baseline at least 932 to 671 systolic. Pt denies chest pain but reports slight headache. Reports fluctuations found in HR on echo.

## 2021-08-27 NOTE — Discharge Instructions (Signed)
Please call the number provided for cardiology to arrange a follow-up appointment for further evaluation and consideration of Holter monitor testing.  Your work-up in the emergency department has resulted showing normal results, with normal pulse rates throughout your stay.  Return to the emergency department if you develop any chest pain, trouble breathing, or any other symptom personally concerning to yourself.

## 2021-08-27 NOTE — ED Triage Notes (Signed)
Pt sent from echo for HR ranging 40-200. Pt reports dizziness and overall not feeling well since covid in sept.  NAD noted Denies cardiac hx Denies chest pain

## 2021-08-28 ENCOUNTER — Telehealth: Payer: Self-pay | Admitting: Pulmonary Disease

## 2021-08-28 NOTE — Telephone Encounter (Signed)
Patient dropped off medical evaluation form in office. Emailed to [employer] for review.  Called patient who states the ER took him out of work starting today, 11/10 with no return to work date as of now. Patient is scheduled to see his cardiologist today. Patient needs continuous leave.

## 2021-08-29 ENCOUNTER — Other Ambulatory Visit: Payer: Self-pay

## 2021-08-29 DIAGNOSIS — Z8616 Personal history of COVID-19: Secondary | ICD-10-CM

## 2021-08-29 DIAGNOSIS — R0602 Shortness of breath: Secondary | ICD-10-CM

## 2021-08-29 DIAGNOSIS — J4550 Severe persistent asthma, uncomplicated: Secondary | ICD-10-CM

## 2021-09-04 NOTE — Telephone Encounter (Signed)
Spoke with patient's wife and informed her we are working on the forms. Emailed Patrice to check the status- informed patient's wife we should be able to complete forms by end of day tomorrow.   Dr. Halford Chessman is in [place] this week so we will have to fax forms for signature then fax to [facility] for patient to pickup.

## 2021-09-05 NOTE — Telephone Encounter (Signed)
Forms have been sent to [facility] for Dr. Juanetta Gosling signature. Per Patrice--Dr. Halford Chessman wrote a note for light duty - according to his job description it doesn't look like he has the option for light duty so I have done continuous leave as requested by the patient - this may need to be explained to Dr. Halford Chessman or signing provider."

## 2021-09-05 NOTE — Telephone Encounter (Signed)
Forms signed- faxed to [facility] for patient pickup. Spoke with patient who states he will pick up on Monday morning. Informed of $29 form fee.

## 2021-09-18 ENCOUNTER — Encounter: Payer: Self-pay | Admitting: Pulmonary Disease

## 2021-09-19 ENCOUNTER — Telehealth: Payer: Self-pay

## 2021-09-19 NOTE — Telephone Encounter (Signed)
Created in error

## 2021-09-19 NOTE — Telephone Encounter (Signed)
Dr Halford Chessman Please advise:  Stephen Cochran is now stating I need to have all working restrictions taken off my papers so that it doesn't seem that I am on light duty restrictions. I'm sorry for such craziness.  [name]

## 2021-09-19 NOTE — Telephone Encounter (Signed)
Pt returned phone call. Patient aware of upcoming COVID test. Nothing further needed.

## 2021-09-19 NOTE — Telephone Encounter (Signed)
Called and LVM in regards to upcoming covid test on 12/5. Will try again later. Routing to [employer] triage

## 2021-09-22 ENCOUNTER — Other Ambulatory Visit
Admission: RE | Admit: 2021-09-22 | Discharge: 2021-09-22 | Disposition: A | Discharge status: Home / Self Care | Payer: BC Managed Care – PPO | Source: Ambulatory Visit | Attending: Pulmonary Disease | Admitting: Pulmonary Disease

## 2021-09-22 ENCOUNTER — Other Ambulatory Visit: Payer: Self-pay

## 2021-09-22 DIAGNOSIS — Z01812 Encounter for preprocedural laboratory examination: Secondary | ICD-10-CM | POA: Diagnosis present

## 2021-09-22 DIAGNOSIS — Z20822 Contact with and (suspected) exposure to covid-19: Secondary | ICD-10-CM | POA: Insufficient documentation

## 2021-09-23 ENCOUNTER — Ambulatory Visit: Payer: BC Managed Care – PPO | Attending: Pulmonary Disease

## 2021-09-23 DIAGNOSIS — Z8616 Personal history of COVID-19: Secondary | ICD-10-CM | POA: Insufficient documentation

## 2021-09-23 DIAGNOSIS — J4550 Severe persistent asthma, uncomplicated: Secondary | ICD-10-CM | POA: Diagnosis not present

## 2021-09-23 DIAGNOSIS — R0602 Shortness of breath: Secondary | ICD-10-CM | POA: Diagnosis present

## 2021-09-23 LAB — PULMONARY FUNCTION TEST ARMC ONLY
DL/VA % pred: 107 %
DL/VA: 4.87 ml/min/mmHg/L
DLCO unc % pred: 84 %
DLCO unc: 23.82 ml/min/mmHg
FEF 25-75 Post: 3.45 L/sec
FEF 25-75 Pre: 2.97 L/sec
FEF2575-%Change-Post: 16 %
FEF2575-%Pred-Post: 100 %
FEF2575-%Pred-Pre: 86 %
FEV1-%Change-Post: 4 %
FEV1-%Pred-Post: 77 %
FEV1-%Pred-Pre: 73 %
FEV1-Post: 2.91 L
FEV1-Pre: 2.78 L
FEV1FVC-%Change-Post: 4 %
FEV1FVC-%Pred-Pre: 104 %
FEV6-%Change-Post: 0 %
FEV6-%Pred-Post: 73 %
FEV6-%Pred-Pre: 73 %
FEV6-Post: 3.42 L
FEV6-Pre: 3.39 L
FEV6FVC-%Pred-Post: 103 %
FEV6FVC-%Pred-Pre: 103 %
FVC-%Change-Post: 0 %
FVC-%Pred-Post: 71 %
FVC-%Pred-Pre: 70 %
FVC-Post: 3.42 L
FVC-Pre: 3.39 L
Post FEV1/FVC ratio: 85 %
Post FEV6/FVC ratio: 100 %
Pre FEV1/FVC ratio: 82 %
Pre FEV6/FVC Ratio: 100 %
RV % pred: 115 %
RV: 2.15 L
TLC % pred: 85 %
TLC: 5.58 L

## 2021-09-23 LAB — SARS CORONAVIRUS 2 (TAT 6-24 HRS): SARS Coronavirus 2: NEGATIVE

## 2021-09-23 MED ORDER — ALBUTEROL SULFATE (2.5 MG/3ML) 0.083% IN NEBU
2.5000 mg | INHALATION_SOLUTION | Freq: Once | RESPIRATORY_TRACT | Status: AC
  Administered 2021-09-23: 2.5 mg via RESPIRATORY_TRACT
  Filled 2021-09-23: qty 3

## 2021-09-23 NOTE — Telephone Encounter (Signed)
Forms received via fax- emailed to [place]. Patient requesting return date of 10/20/2021 with no restrictions.

## 2021-09-24 NOTE — Telephone Encounter (Signed)
Forms sent back for VS's signature.

## 2021-09-26 NOTE — Telephone Encounter (Signed)
VS was not able to sign when he was in office, Rexene Edison, NP reviewed forms and signed. Faxed to insurance and copy will be ready for patient at his appointment on 12/13 in [place]. Left voicemail informing patient.

## 2021-09-30 ENCOUNTER — Ambulatory Visit
Admission: RE | Admit: 2021-09-30 | Discharge: 2021-09-30 | Disposition: A | Discharge status: Home / Self Care | Payer: BC Managed Care – PPO | Source: Ambulatory Visit | Attending: Adult Health | Admitting: Adult Health

## 2021-09-30 ENCOUNTER — Ambulatory Visit (INDEPENDENT_AMBULATORY_CARE_PROVIDER_SITE_OTHER): Payer: BC Managed Care – PPO | Admitting: Adult Health

## 2021-09-30 ENCOUNTER — Encounter: Payer: Self-pay | Admitting: Adult Health

## 2021-09-30 ENCOUNTER — Other Ambulatory Visit: Payer: Self-pay

## 2021-09-30 VITALS — BP 122/80 | HR 66 | Temp 98.2°F | Ht 68.0 in | Wt 231.6 lb

## 2021-09-30 DIAGNOSIS — J45909 Unspecified asthma, uncomplicated: Secondary | ICD-10-CM | POA: Insufficient documentation

## 2021-09-30 DIAGNOSIS — R0602 Shortness of breath: Secondary | ICD-10-CM | POA: Diagnosis not present

## 2021-09-30 DIAGNOSIS — U099 Post covid-19 condition, unspecified: Secondary | ICD-10-CM | POA: Diagnosis not present

## 2021-09-30 DIAGNOSIS — R400 Somnolence: Secondary | ICD-10-CM | POA: Diagnosis not present

## 2021-09-30 DIAGNOSIS — G4719 Other hypersomnia: Secondary | ICD-10-CM | POA: Diagnosis not present

## 2021-09-30 DIAGNOSIS — K029 Dental caries, unspecified: Secondary | ICD-10-CM

## 2021-09-30 DIAGNOSIS — J4530 Mild persistent asthma, uncomplicated: Secondary | ICD-10-CM

## 2021-09-30 MED ORDER — FLUTICASONE FUROATE-VILANTEROL 100-25 MCG/ACT IN AEPB: 1.0000 | INHALATION_SPRAY | Freq: Every day | RESPIRATORY_TRACT | 0 refills | Status: DC

## 2021-09-30 NOTE — Progress Notes (Signed)
Reviewed and agree with assessment/plan.   Chesley Mires, MD [facility] 09/30/2021, 10:58 AM Pager:  [phone]

## 2021-09-30 NOTE — Assessment & Plan Note (Signed)
Patient has lingering long-haul COVID symptoms with dizziness, fatigue, activity intolerance, shortness of breath, cough.  Patient's had an extensive work-up with normal 2D echo, mild restriction on PFTs with no significant airflow obstruction, labs unrevealing. Patient is advised to advance activity as tolerated. We will treat for possible underlying asthmatic bronchitic exacerbation.  We will also increase asthma maintenance inhaler to dual therapy with ICS/LABA-Breo  Plan  Patient Instructions  Stop Arnuity .  Begin BREO 1 puff daily,  Augmentin 875mg  Twice daily  for 1 week . -take with food.  Prednisone 20mg  daily for 5 days-take with food.  Delsym 2 tsp Twice daily for cough As needed   Begin Zyrtec 10mg  At bedtime   Continue Protonix daily  Chest xray today   Set up for Home sleep study .   Set up dental follow up   Flu shot next week if feeling better.   Activity as tolerated.   Follow up with Dr. Halford Chessman or Mizani Dilday NP  in 4-6 weeks and As needed

## 2021-09-30 NOTE — Progress Notes (Signed)
@Patient  ID: Leodis Binet, male    DOB: [birth year], 45 y.o.   MRN: [id number]  Chief Complaint  Patient presents with   Follow-up    Referring provider: Practice, D[facility]*  HPI: 45 year old male never smoker seen for pulmonary consult September 17, 2021 for shortness of breath post COVID-19 infection Medical history significant for Bipolar disorder  TEST/EVENTS :  Chest xray 06/2021 report clear lungs   09/30/2021 Follow up: Dyspnea  Patient returns for a 6-week follow-up.  Patient was seen last visit for a pulmonary consult for shortness of breath after COVID-19 infection.  Patient was felt to have possible asthma component   He was started on Arnuity daily.  And given albuterol as needed. He was set up for a 2D echo that showed normal EF at 55 to 60%,  PFTs showed mild to moderate restriction with an FEV1 at 77%, ratio 85, FVC 71%, no significant bronchodilator response, normal DLCO 84% Prior to Covid 19 had no breathing issues or prior COPD/Asthma dx . Patient has lingering symptoms since covid with Dizziness, decreased activity tolerance, stamina , fatigue , memory issues . Has not been able to work.  Coughing up thick dark mucus , seems to be getting worse .   Wife is going through treatment for RA .   Complains of Restless sleep, snoring, wakes up unrefreshed. Feels he has not slept at all when he wakes up Goes to bed 7-8 pm , wakes up several times a night . Wakes up at 5 am .   Patient was seen by cardiology and had a exercise stress test.  Results are not available.  Patient says it was reported as okay but he had significant difficulties with dizziness and subsequently ended up in the emergency room.  He is planning on following back up with cardiology.  No Known Allergies   There is no immunization history on file for this patient.  Past Medical History:  Diagnosis Date   Allergies    Anxiety    Arthritis    COVID-19    GERD (gastroesophageal reflux  disease)    Hyperlipidemia    Hypertension    Melanoma ([place]) 04/13/2013   L mid upper back - excision    Nephrolithiasis     Tobacco History: Social History   Tobacco Use  Smoking Status Never  Smokeless Tobacco Current   Ready to quit: Not Answered Counseling given: Not Answered   Outpatient Medications Prior to Visit  Medication Sig Dispense Refill   albuterol (VENTOLIN HFA) 108 (90 Base) MCG/ACT inhaler Inhale 2 puffs into the lungs every 6 (six) hours as needed for wheezing or shortness of breath. 8 g 5   atenolol (TENORMIN) 100 MG tablet Take 100 mg by mouth daily.     ciclopirox (LOPROX) 0.77 % cream Apply topically 2 (two) times daily.     Fluticasone Furoate (ARNUITY ELLIPTA) 100 MCG/ACT AEPB Inhale 1 puff into the lungs daily. 30 each 5   lamoTRIgine (LAMICTAL) 100 MG tablet Take 100 mg by mouth daily.     losartan (COZAAR) 100 MG tablet Take 100 mg by mouth daily.     meloxicam (MOBIC) 7.5 MG tablet Take 7.5 mg by mouth daily.     Multiple Vitamins-Minerals (MULTIVITAMIN WITH MINERALS) tablet Take 1 tablet by mouth daily.     naltrexone (DEPADE) 50 MG tablet Take 100 mg by mouth daily.     pantoprazole (PROTONIX) 40 MG tablet Take 1 tablet by mouth daily.  No facility-administered medications prior to visit.     Review of Systems:   Constitutional:   No  weight loss, night sweats,  Fevers, chills,  +fatigue, or  lassitude.  HEENT:   No headaches,  Difficulty swallowing,  Tooth/dental problems, or  Sore throat,                No sneezing, itching, ear ache,  +nasal congestion, post nasal drip,   CV:  No chest pain,  Orthopnea, PND, swelling in lower extremities, anasarca, dizziness, palpitations, syncope.   GI  No heartburn, indigestion, abdominal pain, nausea, vomiting, diarrhea, change in bowel habits, loss of appetite, bloody stools.   Resp:  No chest wall deformity  Skin: no rash or lesions.  GU: no dysuria, change in color of urine, no urgency or  frequency.  No flank pain, no hematuria   MS:  No joint pain or swelling.  No decreased range of motion.  No back pain.    Physical Exam  BP 122/80 (BP Location: Left Arm, Patient Position: Sitting, Cuff Size: Normal)   Pulse 66   Temp 98.2 F (36.8 C) (Oral)   Ht 5\' 8"  (1.727 m)   Wt 231 lb 9.6 oz (105.1 kg)   SpO2 98%   BMI 35.21 kg/m   GEN: A/Ox3; pleasant , NAD, well nourished    HEENT:  [place]/AT,  Nose -clear, THROAT-clear, no lesions, no postnasal drip or exudate noted. Multiple dental caries and broken teeth . Class 2-3 MP airway   NECK:  Supple w/ fair ROM; no JVD; normal carotid impulses w/o bruits; no thyromegaly or nodules palpated; no lymphadenopathy.    RESP  Clear  P & A; w/o, wheezes/ rales/ or rhonchi. no accessory muscle use, no dullness to percussion  CARD:  RRR, no m/r/g, no peripheral edema, pulses intact, no cyanosis or clubbing.  GI:   Soft & nt; nml bowel sounds; no organomegaly or masses detected.   Musco: Warm bil, no deformities or joint swelling noted.   Neuro: alert, no focal deficits noted.    Skin: Warm, no lesions or rashes    Lab Results:  CBC    Component Value Date/Time   WBC 10.2 08/27/2021 1113   RBC 4.96 08/27/2021 1113   HGB 14.3 08/27/2021 1113   HCT 43.0 08/27/2021 1113   PLT 307 08/27/2021 1113   MCV 86.7 08/27/2021 1113   MCH 28.8 08/27/2021 1113   MCHC 33.3 08/27/2021 1113   RDW 13.6 08/27/2021 1113    BMET    Component Value Date/Time   NA 137 08/27/2021 1113   K 4.5 08/27/2021 1113   CL 101 08/27/2021 1113   CO2 28 08/27/2021 1113   GLUCOSE 111 (H) 08/27/2021 1113   BUN 14 08/27/2021 1113   CREATININE 0.95 08/27/2021 1113   CALCIUM 9.6 08/27/2021 1113   GFRNONAA >60 08/27/2021 1113    BNP No results found for: BNP  ProBNP No results found for: PROBNP  Imaging: No results found.  albuterol (PROVENTIL) (2.5 MG/3ML) 0.083% nebulizer solution 2.5 mg     Date Action Dose Route User   09/23/2021 1410  Given 2.5 mg Nebulization Dayton Martes, RT       PFT Results Latest Ref Rng & Units 09/23/2021  FVC-Pre L 3.39  FVC-Predicted Pre % 70  FVC-Post L 3.42  FVC-Predicted Post % 71  Pre FEV1/FVC % % 82  Post FEV1/FCV % % 85  FEV1-Pre L 2.78  FEV1-Predicted Pre % 73  FEV1-Post L 2.91  DLCO uncorrected ml/min/mmHg 23.82  DLCO UNC% % 84  DLVA Predicted % 107  TLC L 5.58  TLC % Predicted % 85  RV % Predicted % 115    No results found for: NITRICOXIDE      Assessment & Plan:   COVID-19 long hauler Patient has lingering long-haul COVID symptoms with dizziness, fatigue, activity intolerance, shortness of breath, cough.  Patient's had an extensive work-up with normal 2D echo, mild restriction on PFTs with no significant airflow obstruction, labs unrevealing. Patient is advised to advance activity as tolerated. We will treat for possible underlying asthmatic bronchitic exacerbation.  We will also increase asthma maintenance inhaler to dual therapy with ICS/LABA-Breo  Plan  Patient Instructions  Stop Arnuity .  Begin BREO 1 puff daily,  Augmentin 875mg  Twice daily  for 1 week . -take with food.  Prednisone 20mg  daily for 5 days-take with food.  Delsym 2 tsp Twice daily for cough As needed   Begin Zyrtec 10mg  At bedtime   Continue Protonix daily  Chest xray today   Set up for Home sleep study .   Set up dental follow up   Flu shot next week if feeling better.   Activity as tolerated.   Follow up with Dr. Halford Chessman or Jelisha Weed NP  in 4-6 weeks and As needed       Asthma Mild persistent asthma.  Suspect developed after COVID-19 infection.  We will change Arnuity to [facility]. Albuterol as needed  Plan  Patient Instructions  Stop Arnuity .  Begin BREO 1 puff daily,  Augmentin 875mg  Twice daily  for 1 week . -take with food.  Prednisone 20mg  daily for 5 days-take with food.  Delsym 2 tsp Twice daily for cough As needed   Begin Zyrtec 10mg  At bedtime   Continue Protonix  daily  Chest xray today   Set up for Home sleep study .   Set up dental follow up   Flu shot next week if feeling better.   Activity as tolerated.   Follow up with Dr. Halford Chessman or Verlinda Slotnick NP  in 4-6 weeks and As needed       Dental caries Patient has multiple dental caries.  Have recommended that he follow back up with his dentist.  Prior to [place] he said he was planning extensive dental work.  Would go forward with this plan.  Daytime sleepiness Patient has significant daytime sleepiness, fatigue, restless sleep, BMI 35 all suspicious for possible underlying sleep apnea.  Set up for home sleep study  Plan  Patient Instructions  Stop Arnuity .  Begin BREO 1 puff daily,  Augmentin 875mg  Twice daily  for 1 week . -take with food.  Prednisone 20mg  daily for 5 days-take with food.  Delsym 2 tsp Twice daily for cough As needed   Begin Zyrtec 10mg  At bedtime   Continue Protonix daily  Chest xray today   Set up for Home sleep study .   Set up dental follow up   Flu shot next week if feeling better.   Activity as tolerated.   Follow up with Dr. Halford Chessman or Alexi Dorminey NP  in 4-6 weeks and As needed         Rexene Edison, NP 09/30/2021

## 2021-09-30 NOTE — Assessment & Plan Note (Signed)
Patient has multiple dental caries.  Have recommended that he follow back up with his dentist.  Prior to [place] he said he was planning extensive dental work.  Would go forward with this plan.

## 2021-09-30 NOTE — Assessment & Plan Note (Signed)
Patient has significant daytime sleepiness, fatigue, restless sleep, BMI 35 all suspicious for possible underlying sleep apnea.  Set up for home sleep study  Plan  Patient Instructions  Stop Arnuity .  Begin BREO 1 puff daily,  Augmentin 875mg  Twice daily  for 1 week . -take with food.  Prednisone 20mg  daily for 5 days-take with food.  Delsym 2 tsp Twice daily for cough As needed   Begin Zyrtec 10mg  At bedtime   Continue Protonix daily  Chest xray today   Set up for Home sleep study .   Set up dental follow up   Flu shot next week if feeling better.   Activity as tolerated.   Follow up with Stephen Cochran or Stephen Lopez NP  in 4-6 weeks and As needed

## 2021-09-30 NOTE — Patient Instructions (Addendum)
Stop Arnuity .  Begin BREO 1 puff daily,  Augmentin 875mg  Twice daily  for 1 week . -take with food.  Prednisone 20mg  daily for 5 days-take with food.  Delsym 2 tsp Twice daily for cough As needed   Begin Zyrtec 10mg  At bedtime   Continue Protonix daily  Chest xray today   Set up for Home sleep study .   Set up dental follow up   Flu shot next week if feeling better.   Activity as tolerated.   Follow up with Dr. Halford Chessman or Dafna Romo NP  in 4-6 weeks and As needed

## 2021-09-30 NOTE — Assessment & Plan Note (Signed)
Mild persistent asthma.  Suspect developed after COVID-19 infection.  We will change Arnuity to [facility]. Albuterol as needed  Plan  Patient Instructions  Stop Arnuity .  Begin BREO 1 puff daily,  Augmentin 875mg  Twice daily  for 1 week . -take with food.  Prednisone 20mg  daily for 5 days-take with food.  Delsym 2 tsp Twice daily for cough As needed   Begin Zyrtec 10mg  At bedtime   Continue Protonix daily  Chest xray today   Set up for Home sleep study .   Set up dental follow up   Flu shot next week if feeling better.   Activity as tolerated.   Follow up with Dr. Halford Chessman or Sheera Illingworth NP  in 4-6 weeks and As needed

## 2021-10-03 ENCOUNTER — Telehealth: Payer: Self-pay | Admitting: Adult Health

## 2021-10-03 MED ORDER — PREDNISONE 20 MG PO TABS: 20.0000 mg | ORAL_TABLET | Freq: Every day | ORAL | 0 refills | Status: DC

## 2021-10-03 MED ORDER — AMOXICILLIN-POT CLAVULANATE 875-125 MG PO TABS: 1.0000 | ORAL_TABLET | Freq: Two times a day (BID) | ORAL | 0 refills | Status: DC

## 2021-10-03 NOTE — Telephone Encounter (Signed)
Rx for prednisone and Augmentin has been sent to preferred pharmacy, as instructed in last OV note.  Patient is aware and voiced his understanding. Nothing further needed at this time.

## 2021-10-08 ENCOUNTER — Ambulatory Visit (INDEPENDENT_AMBULATORY_CARE_PROVIDER_SITE_OTHER): Payer: BC Managed Care – PPO | Admitting: Dermatology

## 2021-10-08 ENCOUNTER — Other Ambulatory Visit: Payer: Self-pay

## 2021-10-08 DIAGNOSIS — B353 Tinea pedis: Secondary | ICD-10-CM

## 2021-10-08 DIAGNOSIS — Z7189 Other specified counseling: Secondary | ICD-10-CM | POA: Diagnosis not present

## 2021-10-08 MED ORDER — TERBINAFINE HCL 250 MG PO TABS: 250.0000 mg | ORAL_TABLET | Freq: Every day | ORAL | 0 refills | Status: DC

## 2021-10-08 MED ORDER — WYNZORA 0.005-0.064 % EX CREA: TOPICAL_CREAM | CUTANEOUS | 0 refills | Status: DC

## 2021-10-08 NOTE — Progress Notes (Signed)
° °  Follow-Up Visit   Subjective  Stephen Cochran is a 45 y.o. male who presents for the following: Rash (On the L foot - x 4 mths, patient has used numerous topical tinea creams which helped clear the tinea between his toes, but hasn't help clear the rest of the foot. Patient states no one at home has the rash   The following portions of the chart were reviewed this encounter and updated as appropriate:   Tobacco   Allergies   Meds   Problems   Med Hx   Surg Hx   Fam Hx      Review of Systems:  No other skin or systemic complaints except as noted in HPI or Assessment and Plan.  Objective  Well appearing patient in no apparent distress; mood and affect are within normal limits.  A focused examination was performed including the L foot. Relevant physical exam findings are noted in the Assessment and Plan.   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 Tinea incognito - KOH negative but patient has been using Ciclopirox so it may not show under the microscope.  No family history of psoriasis but that could be a possibility.   Start Lamisil 250mg  po QD. Terbinafine Counseling Start Wynzora cream BID x 2 week to decrease inflammation and symptoms. Then decrease to QD 5d/wk.   Terbinafine is an anti-fungal medicine that can be applied to the skin (over the counter) or taken by mouth (prescription) to treat fungal infections. The pill version is often used to treat fungal infections of the nails or scalp. While most people do not have any side effects from taking terbinafine pills, some possible side effects of the medicine can include taste changes, headache, loss of smell, vision changes, nausea, vomiting, or diarrhea.   Rare side effects can include irritation of the liver, allergic reaction, or decrease in blood counts (which may show up as not feeling well or developing an infection). If you are concerned about any of these side  effects, please stop the medicine and call your doctor, or in the case of an emergency such as feeling very unwell, seek immediate medical care.   Calcipotriene-Betameth Diprop (WYNZORA) 0.005-0.064 % CREA - L foot Apply to aa L foot BID x 2 weeks. Then QD 5d/wk thereafter.  terbinafine (LAMISIL) 250 MG tablet - L foot Take 1 tablet (250 mg total) by mouth daily.  Return for rash f/u in 6 weeks.  Luther Redo, CMA, am acting as scribe for Sarina Ser, MD . Documentation: I have reviewed the above documentation for accuracy and completeness, and I agree with the above.  Sarina Ser, MD

## 2021-10-08 NOTE — Patient Instructions (Signed)

## 2021-10-19 ENCOUNTER — Encounter: Payer: Self-pay | Admitting: Dermatology

## 2021-11-03 ENCOUNTER — Other Ambulatory Visit: Payer: Self-pay

## 2021-11-03 DIAGNOSIS — B353 Tinea pedis: Secondary | ICD-10-CM

## 2021-11-03 MED ORDER — TERBINAFINE HCL 250 MG PO TABS: 250.0000 mg | ORAL_TABLET | Freq: Every day | ORAL | 0 refills | Status: DC

## 2021-11-03 NOTE — Progress Notes (Signed)
Terbinafine PA denied. Called patient and advised him of cash pay option through [place] with [facility]. Patient agrees to send medication there. aw

## 2021-11-13 ENCOUNTER — Ambulatory Visit (INDEPENDENT_AMBULATORY_CARE_PROVIDER_SITE_OTHER): Payer: BC Managed Care – PPO

## 2021-11-13 ENCOUNTER — Other Ambulatory Visit: Payer: Self-pay

## 2021-11-13 DIAGNOSIS — G4719 Other hypersomnia: Secondary | ICD-10-CM

## 2021-11-13 DIAGNOSIS — G4733 Obstructive sleep apnea (adult) (pediatric): Secondary | ICD-10-CM | POA: Diagnosis not present

## 2021-11-17 ENCOUNTER — Other Ambulatory Visit: Payer: Self-pay

## 2021-11-17 ENCOUNTER — Ambulatory Visit (INDEPENDENT_AMBULATORY_CARE_PROVIDER_SITE_OTHER): Payer: BC Managed Care – PPO | Admitting: Pulmonary Disease

## 2021-11-17 ENCOUNTER — Encounter: Payer: Self-pay | Admitting: Pulmonary Disease

## 2021-11-17 ENCOUNTER — Telehealth: Payer: Self-pay

## 2021-11-17 VITALS — BP 130/80 | HR 66 | Temp 98.4°F | Ht 68.0 in | Wt 231.6 lb

## 2021-11-17 DIAGNOSIS — J4540 Moderate persistent asthma, uncomplicated: Secondary | ICD-10-CM

## 2021-11-17 DIAGNOSIS — Z7189 Other specified counseling: Secondary | ICD-10-CM

## 2021-11-17 DIAGNOSIS — G4733 Obstructive sleep apnea (adult) (pediatric): Secondary | ICD-10-CM

## 2021-11-17 DIAGNOSIS — U099 Post covid-19 condition, unspecified: Secondary | ICD-10-CM

## 2021-11-17 MED ORDER — FLUTICASONE FUROATE-VILANTEROL 100-25 MCG/ACT IN AEPB: 1.0000 | INHALATION_SPRAY | Freq: Every day | RESPIRATORY_TRACT | 5 refills | Status: DC

## 2021-11-17 NOTE — Progress Notes (Signed)
[facility]  Chief Complaint  Patient presents with   Follow-up    Past Surgical History:  He  has a past surgical history that includes Shoulder surgery; Colonoscopy; and Esophagogastroduodenoscopy.  Past Medical History:  HTN, Melanoma 2014, Allergies, Anxiety, OA, GERD, HLD, Nephrolithiasis, COVID 19   Constitutional:  BP 130/80 (BP Location: Left Arm, Patient Position: Sitting, Cuff Size: Normal)    Pulse 66    Temp 98.4 F (36.9 C) (Oral)    Ht 5\' 8"  (1.727 m)    Wt 231 lb 9.6 oz (105.1 kg)    SpO2 99%    BMI 35.21 kg/m   Brief Summary:  Stephen Cochran is a 46 y.o. male with dyspnea.      Subjective:   He had home sleep study.  Showed mild sleep apnea.  Chest xray from 09/30/21 was normal.  PFT from 09/23/21 was normal.  Echo from 08/27/21 showed mild LVH.  He gets wheeze and chest tightness intermittently.  Has been using arnuity.  Uses albuterol several times per week and this helps.  Physical Exam:   Appearance - well kempt   ENMT - no sinus tenderness, no oral exudate, no LAN, Mallampati 3 airway, no stridor, poor dentition  Respiratory - equal breath sounds bilaterally, no wheezing or rales  CV - s1s2 regular rate and rhythm, no murmurs  Ext - no clubbing, no edema  Skin - no rashes  Psych - normal mood and affect    Pulmonary testing:  PFT 09/23/21 >> FEV1 2.91 (77%), FEV1% 85, TLC 5.58 (85%), DLCO 84%  Chest Imaging:    Sleep Tests:  HST 11/13/21 >> AHI 9, SpO2 low 80%  Cardiac Tests:  Echo 08/27/21 >> EF 55 to 60%, mild LVH  Social History:  He  reports that he has never smoked. He uses smokeless tobacco. He reports that he does not drink alcohol and does not use drugs.  Family History:  His family history includes Cancer in his mother; Colon cancer in his father; Coronary artery disease in his father; Hypertension in his father.      Assessment/Plan:   Moderate, persistent  asthma. - change from arnuity to breo 100 one puff daily - prn albuterol  Obstructive sleep apnea. - reviewed his sleep study - discussed how sleep apnea can impact his health - treatment options reviewed - will arrange for auto CPAP set up  [place]. - discussed symptomatic management  Time Spent Involved in Patient Care on Day of Examination:  38 minutes  Follow up:   Patient Instructions  Breo 1 puff daily, and rinse your mouth after each use  Stop using arnuity once you get breo  Albuterol two puffs every 6 hours as needed for cough, wheeze, or chest congestion  Will arrange for auto CPAP set up  Follow up in 4 to 5 months  Medication List:   Allergies as of 11/17/2021   No Known Allergies      Medication List        Accurate as of November 17, 2021 10:12 AM. If you have any questions, ask your nurse or doctor.          STOP taking these medications    amoxicillin-clavulanate 875-125 MG tablet Commonly known as: AUGMENTIN Stopped by: Chesley Mires, MD   Arnuity Ellipta 100 MCG/ACT Aepb Generic drug: Fluticasone Furoate Stopped by: Chesley Mires, MD   predniSONE 20 MG tablet Commonly known as: DELTASONE Stopped  by: Chesley Mires, MD       TAKE these medications    albuterol 108 (90 Base) MCG/ACT inhaler Commonly known as: VENTOLIN HFA Inhale 2 puffs into the lungs every 6 (six) hours as needed for wheezing or shortness of breath.   atenolol 100 MG tablet Commonly known as: TENORMIN Take 100 mg by mouth daily.   ciclopirox 0.77 % cream Commonly known as: LOPROX Apply topically 2 (two) times daily.   fluticasone furoate-vilanterol 100-25 MCG/ACT Aepb Commonly known as: Breo Ellipta Inhale 1 puff into the lungs daily.   lamoTRIgine 100 MG tablet Commonly known as: LAMICTAL Take 100 mg by mouth daily.   losartan 100 MG tablet Commonly known as: COZAAR Take 100 mg by mouth daily.   meloxicam 7.5 MG tablet Commonly known as:  MOBIC Take 7.5 mg by mouth daily.   multivitamin with minerals tablet Take 1 tablet by mouth daily.   naltrexone 50 MG tablet Commonly known as: DEPADE Take 100 mg by mouth daily.   pantoprazole 40 MG tablet Commonly known as: PROTONIX Take 1 tablet by mouth daily.   terbinafine 250 MG tablet Commonly known as: LamISIL Take 1 tablet (250 mg total) by mouth daily.   Wynzora 0.005-0.064 % Crea Generic drug: Calcipotriene-Betameth Diprop Apply to aa L foot BID x 2 weeks. Then QD 5d/wk thereafter.        Signature:  Chesley Mires, MD [place] Pager - [phone] 11/17/2021, 10:12 AM

## 2021-11-17 NOTE — Patient Instructions (Signed)
Breo 1 puff daily, and rinse your mouth after each use  Stop using arnuity once you get breo  Albuterol two puffs every 6 hours as needed for cough, wheeze, or chest congestion  Will arrange for auto CPAP set up  Follow up in 4 to 5 months

## 2021-11-18 ENCOUNTER — Encounter: Payer: Self-pay | Admitting: Dermatology

## 2021-11-19 ENCOUNTER — Other Ambulatory Visit: Payer: Self-pay

## 2021-11-19 ENCOUNTER — Ambulatory Visit (INDEPENDENT_AMBULATORY_CARE_PROVIDER_SITE_OTHER): Payer: BC Managed Care – PPO | Admitting: Dermatology

## 2021-11-19 DIAGNOSIS — L409 Psoriasis, unspecified: Secondary | ICD-10-CM | POA: Diagnosis not present

## 2021-11-19 DIAGNOSIS — B353 Tinea pedis: Secondary | ICD-10-CM | POA: Diagnosis not present

## 2021-11-19 MED ORDER — WYNZORA 0.005-0.064 % EX CREA: TOPICAL_CREAM | CUTANEOUS | 3 refills | Status: DC

## 2021-11-19 MED ORDER — TERBINAFINE HCL 250 MG PO TABS: 250.0000 mg | ORAL_TABLET | Freq: Every day | ORAL | 0 refills | Status: DC

## 2021-11-19 NOTE — Progress Notes (Signed)
° °  Follow-Up Visit   Subjective  Stephen Cochran is a 46 y.o. male who presents for the following: Follow-up (Tinea follow up of left foot - "1000 times better" - treated with Lamisil 250 mg and Wynzora cream).  The following portions of the chart were reviewed this encounter and updated as appropriate:   Tobacco   Allergies   Meds   Problems   Med Hx   Surg Hx   Fam Hx      Review of Systems:  No other skin or systemic complaints except as noted in HPI or Assessment and Plan.  Objective  Well appearing patient in no apparent distress; mood and affect are within normal limits.  A focused examination was performed including left foot. Relevant physical exam findings are noted in the Assessment and Plan.   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 It is associated with an increased risk of inflammatory bowel disease, heart disease, non-alcoholic fatty liver disease, and depression.    Related Medications Calcipotriene-Betameth Diprop (WYNZORA) 0.005-0.064 % CREA QD 5d/wk  terbinafine (LAMISIL) 250 MG tablet Take 1 tablet (250 mg total) by mouth daily.  Return in about 4 months (around 03/19/2022).  I, Ashok Cordia, CMA, am acting as scribe for Sarina Ser, MD . Documentation: I have reviewed the above documentation for accuracy and completeness, and I agree with the above.  Sarina Ser, MD

## 2021-11-19 NOTE — Patient Instructions (Signed)
Recommend Cerave cream daily   If You Need Anything After Your Visit  If you have any questions or concerns for your doctor, please call our main line at [phone] and press option 4 to reach your doctor's medical assistant. If no one answers, please leave a voicemail as directed and we will return your call as soon as possible. Messages left after 4 pm will be answered the following business day.   You may also send [place] a message via [place]. We typically respond to MyChart messages within 1-2 business days.  For prescription refills, please ask your pharmacy to contact our office. Our fax number is [phone].  If you have an urgent issue when the clinic is closed that cannot wait until the next business day, you can page your doctor at the number below.    Please note that while we do our best to be available for urgent issues outside of office hours, we are not available 24/7.   If you have an urgent issue and are unable to reach [place], you may choose to seek medical care at your doctor's office, retail clinic, urgent care center, or emergency room.  If you have a medical emergency, please immediately call 911 or go to the emergency department.  Pager Numbers  - Dr. Nehemiah Massed: [phone]  - Dr. Laurence Ferrari: [phone]  - Dr. Nicole Kindred: [phone]  In the event of inclement weather, please call our main line at [phone] for an update on the status of any delays or closures.  Dermatology Medication Tips: Please keep the boxes that topical medications come in in order to help keep track of the instructions about where and how to use these. Pharmacies typically print the medication instructions only on the boxes and not directly on the medication tubes.   If your medication is too expensive, please contact our office at [phone] option 4 or send [place] a message through [place].   We are unable to tell what your co-pay for medications will be in advance as this is different  depending on your insurance coverage. However, we may be able to find a substitute medication at lower cost or fill out paperwork to get insurance to cover a needed medication.   If a prior authorization is required to get your medication covered by your insurance company, please allow [place] 1-2 business days to complete this process.  Drug prices often vary depending on where the prescription is filled and some pharmacies may offer cheaper prices.  The website www.goodrx.com contains coupons for medications through different pharmacies. The prices here do not account for what the cost may be with help from insurance (it may be cheaper with your insurance), but the website can give you the price if you did not use any insurance.  - You can print the associated coupon and take it with your prescription to the pharmacy.  - You may also stop by our office during regular business hours and pick up a GoodRx coupon card.  - If you need your prescription sent electronically to a different pharmacy, notify our office through [facility] or by phone at [phone] option 4.     Si Usted Necesita Algo Despus de Su Visita  Tambin puede enviarnos un mensaje a travs de Pharmacist, community. Por lo general respondemos a los mensajes de MyChart en el transcurso de 1 a 2 das hbiles.  Para renovar recetas, por favor pida a su farmacia que se ponga en contacto con nuestra oficina. Harland Dingwall de fax  es el [phone].  Si tiene un asunto urgente cuando la clnica est cerrada y que no puede esperar hasta el siguiente da hbil, puede llamar/localizar a su doctor(a) al nmero que aparece a continuacin.   Por favor, tenga en cuenta que aunque hacemos todo lo posible para estar disponibles para asuntos urgentes fuera del horario de [place], no estamos disponibles las 24 horas del da, los 7 das de la [place].   Si tiene un problema urgente y no puede comunicarse con nosotros, puede optar por buscar atencin  mdica  en el consultorio de su doctor(a), en una clnica privada, en un centro de atencin urgente o en una sala de emergencias.  Si tiene Engineering geologist, por favor llame inmediatamente al 911 o vaya a la sala de emergencias.  Nmeros de bper  - Dr. Nehemiah Massed: [phone]  - Dra. Moye: [phone]  - Dra. Nicole Kindred: [phone]  En caso de inclemencias del [place], por favor llame a Johnsie Kindred principal al [phone] para una actualizacin sobre el [place] de cualquier retraso o cierre.  Consejos para la medicacin en dermatologa: Por favor, guarde las cajas en las que vienen los medicamentos de uso tpico para ayudarle a seguir las instrucciones sobre dnde y cmo usarlos. Las farmacias generalmente imprimen las instrucciones del medicamento slo en las cajas y no directamente en los tubos del [place].   Si su medicamento es muy caro, por favor, pngase en contacto con Zigmund Daniel llamando al [phone] y presione la opcin 4 o envenos un mensaje a travs de Pharmacist, community.   No podemos decirle cul ser su copago por los medicamentos por adelantado ya que esto es diferente dependiendo de la cobertura de su seguro. Sin embargo, es posible que podamos encontrar un medicamento sustituto a Electrical engineer un formulario para que el seguro cubra el medicamento que se considera necesario.   Si se requiere una autorizacin previa para que su compaa de seguros [place] su medicamento, por favor permtanos de 1 a 2 das hbiles para completar este proceso.  Los precios de los medicamentos varan con frecuencia dependiendo del Environmental consultant de dnde se surte la receta y alguna farmacias pueden ofrecer precios ms baratos.  El sitio web www.goodrx.com tiene cupones para medicamentos de Airline pilot. Los precios aqu no tienen en cuenta lo que podra costar con la ayuda del seguro (puede ser ms barato con su seguro), pero el sitio web puede darle el precio si no utiliz Conservation officer, historic buildings.  - Puede imprimir el cupn correspondiente y llevarlo con su receta a la farmacia.  - Tambin puede pasar por nuestra oficina durante el horario de atencin regular y Charity fundraiser una tarjeta de cupones de GoodRx.  - Si necesita que su receta se enve electrnicamente a una farmacia diferente, informe a nuestra oficina a travs de MyChart de [place] o por telfono llamando al [phone] y presione la opcin 4.

## 2021-11-21 NOTE — Telephone Encounter (Signed)
Created in error

## 2021-11-22 ENCOUNTER — Encounter: Payer: Self-pay | Admitting: Dermatology

## 2021-12-19 ENCOUNTER — Other Ambulatory Visit (HOSPITAL_COMMUNITY): Payer: Self-pay

## 2022-01-08 IMAGING — CR DG CHEST 2V
2 series · 2 of 2 positions shown · non-contrast
Comparison: X-ray chest 07/22/2021.

CLINICAL DATA: Shortness of breath and on off chest pain since
having COVID in [REDACTED]. Fatigue.

EXAM:
CHEST - 2 VIEW

[chest pa]
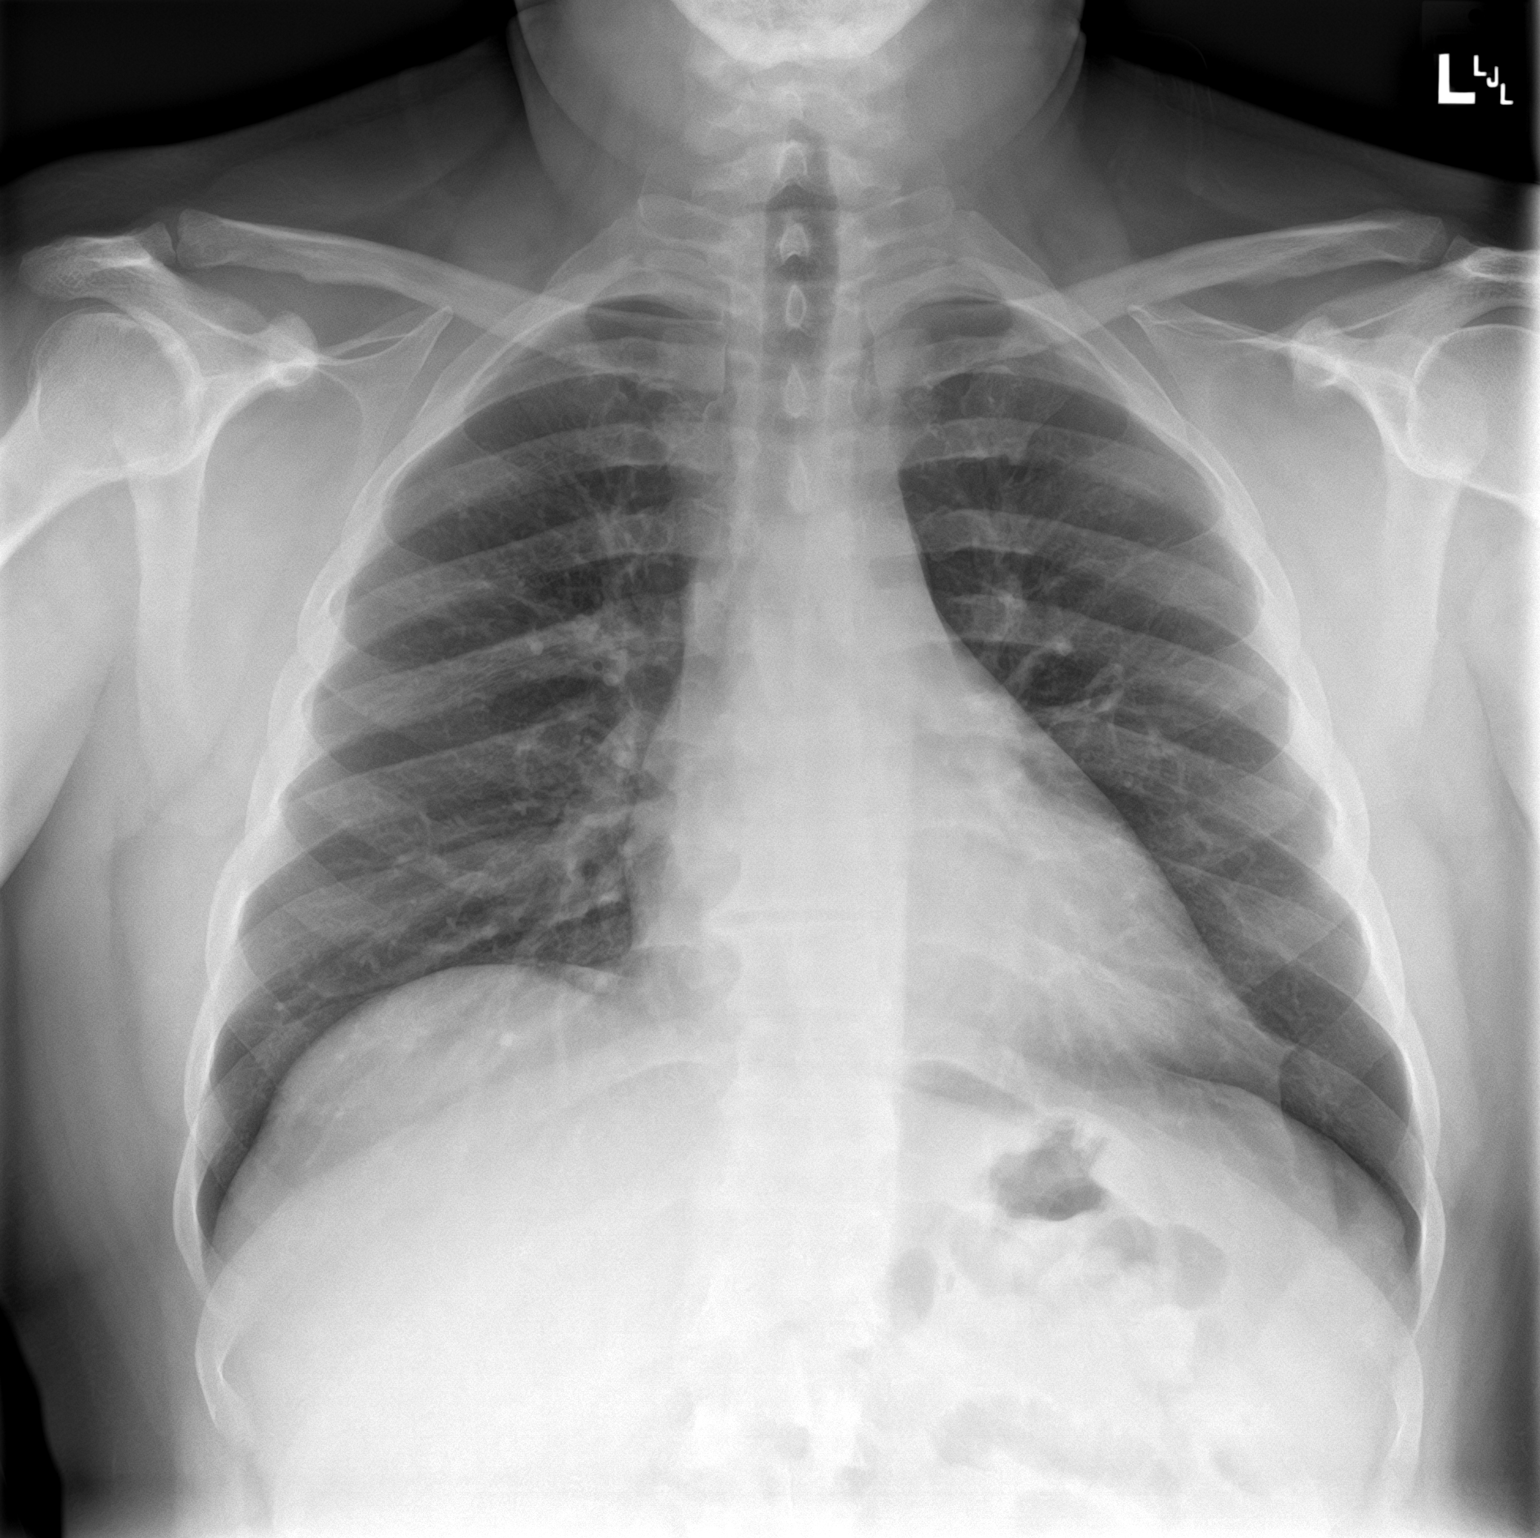

[chest lat]
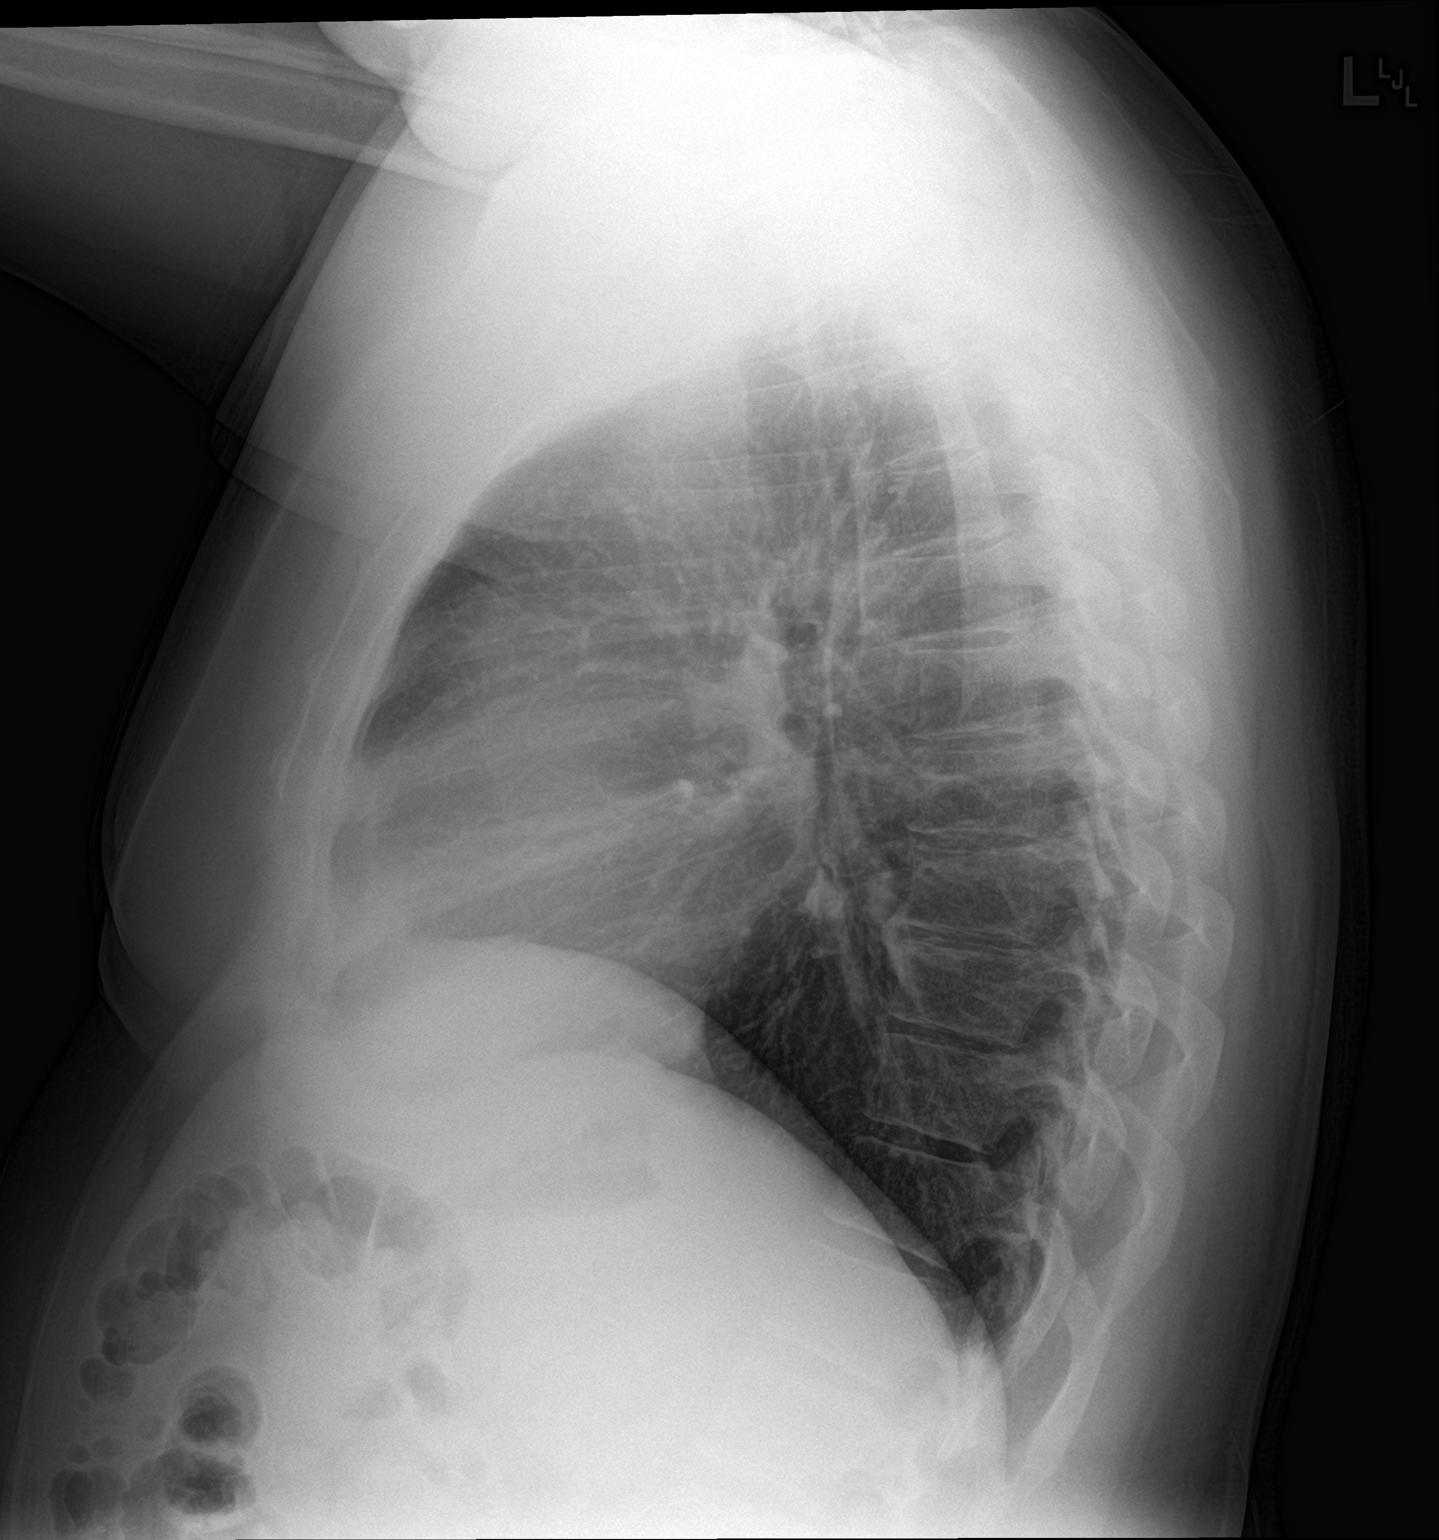

[2 of 2 positions shown; findings below may reference images not displayed]

FINDINGS: The heart size and mediastinal contours are within normal limits.
Both lungs are clear. The visualized skeletal structures are
unremarkable.
IMPRESSION: No active cardiopulmonary disease.

## 2022-01-27 ENCOUNTER — Encounter: Payer: Self-pay | Admitting: Dermatology

## 2022-02-02 ENCOUNTER — Other Ambulatory Visit (HOSPITAL_COMMUNITY): Payer: Self-pay

## 2022-03-30 ENCOUNTER — Ambulatory Visit: Payer: BC Managed Care – PPO | Admitting: Dermatology

## 2023-04-17 ENCOUNTER — Other Ambulatory Visit: Payer: Self-pay | Admitting: Physician Assistant

## 2023-04-17 DIAGNOSIS — M25519 Pain in unspecified shoulder: Secondary | ICD-10-CM

## 2023-04-17 DIAGNOSIS — I10 Essential (primary) hypertension: Secondary | ICD-10-CM

## 2023-04-19 NOTE — Telephone Encounter (Signed)
Requested by interface surescripts. Provider not at this practice  Requested Prescriptions  Refused Prescriptions Disp Refills   losartan (COZAAR) 100 MG tablet [Pharmacy Med Name: LOSARTAN POTASSIUM 100 MG TAB] 90 tablet     Sig: TAKE 1 TABLET BY MOUTH EVERY DAY     Cardiovascular:  Angiotensin Receptor Blockers Failed - 04/17/2023  8:51 AM      Failed - Cr in normal range and within 180 days    Creatinine, Ser  Date Value Ref Range Status  08/27/2021 0.95 0.61 - 1.24 mg/dL Final         Failed - K in normal range and within 180 days    Potassium  Date Value Ref Range Status  08/27/2021 4.5 3.5 - 5.1 mmol/L Final         Failed - Valid encounter within last 6 months    Recent Outpatient Visits   None            Passed - Patient is not pregnant      Passed - Last BP in normal range    BP Readings from Last 1 Encounters:  11/17/21 130/80          meloxicam (MOBIC) 15 MG tablet [Pharmacy Med Name: MELOXICAM 15 MG TABLET] 90 tablet     Sig: TAKE 1 TABLET BY MOUTH EVERY DAY     Analgesics:  COX2 Inhibitors Failed - 04/17/2023  8:51 AM      Failed - Manual Review: Labs are only required if the patient has taken medication for more than 8 weeks.      Failed - HGB in normal range and within 360 days    Hemoglobin  Date Value Ref Range Status  08/27/2021 14.3 13.0 - 17.0 g/dL Final         Failed - Cr in normal range and within 360 days    Creatinine, Ser  Date Value Ref Range Status  08/27/2021 0.95 0.61 - 1.24 mg/dL Final         Failed - HCT in normal range and within 360 days    HCT  Date Value Ref Range Status  08/27/2021 43.0 39.0 - 52.0 % Final         Failed - AST in normal range and within 360 days    No results found for: "POCAST", "AST"       Failed - ALT in normal range and within 360 days    No results found for: "ALT", "LABALT", "POCALT"       Failed - eGFR is 30 or above and within 360 days    GFR, Estimated  Date Value Ref Range Status   08/27/2021 >60 >60 mL/min Final    Comment:    (NOTE) Calculated using the CKD-EPI Creatinine Equation (2021)          Failed - Valid encounter within last 12 months    Recent Outpatient Visits   None            Passed - Patient is not pregnant

## 2024-07-12 ENCOUNTER — Ambulatory Visit (INDEPENDENT_AMBULATORY_CARE_PROVIDER_SITE_OTHER): Admitting: Urology

## 2024-07-12 VITALS — BP 116/75 | HR 64 | Wt 248.0 lb

## 2024-07-12 DIAGNOSIS — R972 Elevated prostate specific antigen [PSA]: Secondary | ICD-10-CM | POA: Diagnosis not present

## 2024-07-12 NOTE — Patient Instructions (Signed)
 Understanding PSA Screening  What is PSA Screening? PSA stands for Prostate-Specific Antigen. It is a protein made by the prostate gland.  PSA is made by normal prostate tissue, but can be elevated in patients with prostate cancer.  There are other factors that can cause an increased PSA besides prostate cancer including an enlarged prostate(BPH), recent ejaculation, infection(prostatitis), inflammation, recent illness or procedure.  A normal PSA level is less than 4 for men under age 55.  Why is PSA Screening Important? Prostate cancer is a common cancer in men, by finding prostate cancer early before patients have symptoms we can often cure this with either surgery or radiation.  Some prostate cancers grow very slowly and do not need any intervention and can be safely monitored(active surveillance).  Our goal is to find those more aggressive prostate cancers that if untreated would spread outside the prostate and cause symptoms or death.  If prostate cancer spreads outside the prostate, it cannot usually be cured, but multiple treatments are available to slow the growth of cancer and prolong life.  Who Should Get Tested? Most patients should start PSA testing around age 78 or 68, however high risk patients with a family history of prostate cancer, African American descent, patients with a strong family history of breast cancer should consider screening earlier starting at age 29. After age 48, the risks of screening start outweigh the benefits, and routine screening is not recommended after age 12.  This is because even if you develop prostate cancer in your 51s, 63s, or 90s it tends to grow so slowly that it would not cause symptoms or problems.  What Are the Benefits?    Early detection of prostate cancer More treatment options if cancer is found  Options if you have an elevated PSA: First, a confirmatory second PSA level will always be checked to confirm elevation, as false elevations are  common and we want to avoid any invasive testing if possible If you have 2 elevated PSA levels, options include:  PHI(prostate health index) score: (fancy PSA blood test), this can help determine your risk of prostate cancer if your PSA is mildly elevated between 4 and 10 before moving to more expensive/invasive testing Prostate MRI: Imaging test that can detect areas suspicious for prostate cancer, if MRI is completely normal can potentially avoid a prostate biopsy Prostate biopsy: 10 to 15-minute procedure performed in clinic, can be uncomfortable, 1 to 2% chance of serious bleeding or infection, best test to determine if prostate cancer is present but more invasive   What Are the Risks or Downsides?    Prostate biopsy can be uncomfortable with a small risk of bleeding or infection Some prostate cancers grow very slowly and might never cause problems, and detection may lead to unnecessary treatments Possible side effects from follow-up tests or treatments  -------------------------------------------------------------------------------  Prostate Biopsy Instructions  Stop all aspirin or blood thinners (aspirin, plavix, coumadin, warfarin, motrin, ibuprofen, advil, aleve, naproxen, naprosyn) for 7 days prior to the procedure.  If you have any questions about stopping these medications, please contact your primary care physician or cardiologist.  Having a light meal prior to the procedure is recommended.  If you are diabetic or have low blood sugar please bring a small snack or glucose tablet.  A Fleets enema is needed to be purchased over the counter at a local pharmacy and used 2 hours before you scheduled appointment.  This can be purchased over the counter at any pharmacy.  Antibiotics will  be administered in the clinic at the time of the procedure unless otherwise specified.    Please bring someone with you to the procedure to drive you home.  A follow up appointment has been  scheduled for you to receive the results of the biopsy.  If you have any questions or concerns, please feel free to call the office at [phone] or send a Mychart message.    Thank you, Staff at [facility]

## 2024-07-12 NOTE — Progress Notes (Signed)
   07/12/2024 12:34 PM   Stephen Cochran 03-05-1976 [id number]  Reason for visit: Elevated PSA  History: Initial visit September 2025 for elevated PSA 15 Prior PSA values were low, most recently 2.0 from 2021  Physical Exam: BP 116/75 (BP Location: Left Arm, Patient Position: Sitting, Cuff Size: Large)   Pulse 64   Wt 248 lb (112.5 kg)   SpO2 99%   BMI 37.71 kg/m   Imaging/labs: PSA 15 07/05/2024, 2 previously from 2021  Today: Occasional urinary urgency, denies dysuria or gross hematuria No family history of prostate cancer  Plan:   Elevated PSA: We reviewed the implications of an elevated PSA and the uncertainty surrounding it. In general, a man's PSA increases with age and is produced by both normal and cancerous prostate tissue. The differential diagnosis for elevated PSA includes BPH, prostate cancer, infection/prostatitis, recent intercourse/ejaculation, recent urethroscopic manipulation (foley placement/cystoscopy) or trauma. Management of an elevated PSA can include observation/surveillance, prostate MRI, or prostate biopsy and we discussed this in detail. Our goal is to detect clinically significant prostate cancers, and manage with either active surveillance, surgery, or radiation for localized disease. Risks of prostate biopsy include bleeding, infection (including life threatening sepsis), pain, and lower urinary symptoms. Hematuria, hematospermia, and blood in the stool are all common after biopsy and can persist up to 4 weeks. Repeat PSA in 1 week, if remains elevated we will pursue prostate biopsy.  If downtrending will follow to normal   Stephen JAYSON Burnet, MD  [facility] [address] [place], [place] [address] [phone]

## 2024-07-19 ENCOUNTER — Other Ambulatory Visit

## 2024-07-19 DIAGNOSIS — R972 Elevated prostate specific antigen [PSA]: Secondary | ICD-10-CM

## 2024-07-20 ENCOUNTER — Ambulatory Visit: Payer: Self-pay | Admitting: Urology

## 2024-07-20 LAB — PSA TOTAL (REFLEX TO FREE): Prostate Specific Ag, Serum: 16.6 ng/mL — ABNORMAL HIGH (ref 0.0–4.0)

## 2024-08-30 ENCOUNTER — Ambulatory Visit (INDEPENDENT_AMBULATORY_CARE_PROVIDER_SITE_OTHER): Admitting: Urology

## 2024-08-30 DIAGNOSIS — R972 Elevated prostate specific antigen [PSA]: Secondary | ICD-10-CM

## 2024-08-30 DIAGNOSIS — Z2989 Encounter for other specified prophylactic measures: Secondary | ICD-10-CM | POA: Diagnosis not present

## 2024-08-30 DIAGNOSIS — N411 Chronic prostatitis: Secondary | ICD-10-CM | POA: Diagnosis not present

## 2024-08-30 MED ORDER — GENTAMICIN SULFATE 40 MG/ML IJ SOLN
80.0000 mg | Freq: Once | INTRAMUSCULAR | Status: AC
  Administered 2024-08-30: 80 mg via INTRAMUSCULAR

## 2024-08-30 MED ORDER — LEVOFLOXACIN 500 MG PO TABS
500.0000 mg | ORAL_TABLET | Freq: Once | ORAL | Status: AC
  Administered 2024-08-30: 500 mg via ORAL

## 2024-08-30 NOTE — Addendum Note (Signed)
 Addended by: ELOUISE SANTA BROCKS on: 08/30/2024 03:57 PM   Modules accepted: Orders

## 2024-08-30 NOTE — Progress Notes (Signed)
   08/30/24  Indication:   Prostate Biopsy Procedure   Informed consent was obtained, and we discussed the risks of bleeding and infection/sepsis. A time out was performed to ensure correct patient identity.  Pre-Procedure: - Last PSA Level: 16.6 - Gentamicin and levaquin given for antibiotic prophylaxis - Transrectal Ultrasound performed revealing a 21 gm prostate, PSA density 0.79 - No significant hypoechoic or median lobe noted  Procedure: - Prostate block performed using 10 cc 1% lidocaine and biopsies taken from sextant areas, a total of 12 under ultrasound guidance.  Post-Procedure: - Patient tolerated the procedure well - He was counseled to seek immediate medical attention if experiences significant bleeding, fevers, or severe pain - Return in one week to discuss biopsy results  Assessment/ Plan: Will follow up in 1-2 weeks to discuss pathology  Stephen Burnet, MD 08/30/2024

## 2024-08-30 NOTE — Patient Instructions (Signed)

## 2024-09-01 LAB — PROSTATE CORE NEEDLE BIOPSY

## 2024-09-04 ENCOUNTER — Ambulatory Visit: Payer: Self-pay | Admitting: Urology

## 2024-09-06 ENCOUNTER — Ambulatory Visit: Admitting: Urology

## 2024-09-06 DIAGNOSIS — R972 Elevated prostate specific antigen [PSA]: Secondary | ICD-10-CM

## 2024-09-06 NOTE — Progress Notes (Signed)
   09/06/2024 3:35 PM   Stephen Cochran [birth year] [id number]  Reason for visit: Follow up prostate biopsy results, elevated PSA  History: Elevated PSA values of 15 and 16.6 from September and October 2025, increased from prior value of 1.34 in 2020 Prostate biopsy November 2025 21 g prostate, benign tissue with inflammation but no evidence of malignancy  Imaging/labs: Prostate biopsy November 2025 21 g prostate, benign tissue with chronic inflammation but no evidence of malignancy  Today: Doing well postbiopsy, denies any fevers or chills  Plan:   Elevated PSA: Reassurance provided regarding negative biopsy, and chronic inflammation as possible etiology of his elevated PSA.  We discussed the 10 to 20% false-negative rate of prostate biopsy and need for continued close monitoring of the PSA, and potentially prostate MRI if PSA remains significantly elevated.  He does not have any urinary symptoms that would be indicative of prostatitis or warrant antibiotics, he has been on chronic NSAIDs for joint pain.  I repeat PSA in 8 weeks, and would proceed with MRI if remains significantly elevated   Stephen JAYSON Burnet, MD  [facility] [address] [place], [place] [address] [phone]

## 2024-09-06 NOTE — Patient Instructions (Signed)

## 2024-09-26 ENCOUNTER — Ambulatory Visit: Admitting: Dermatology

## 2024-10-23 ENCOUNTER — Other Ambulatory Visit: Payer: Self-pay

## 2024-10-23 DIAGNOSIS — R972 Elevated prostate specific antigen [PSA]: Secondary | ICD-10-CM

## 2024-10-25 ENCOUNTER — Other Ambulatory Visit

## 2024-10-30 ENCOUNTER — Other Ambulatory Visit

## 2024-10-30 DIAGNOSIS — R972 Elevated prostate specific antigen [PSA]: Secondary | ICD-10-CM

## 2024-10-31 LAB — PSA: Prostate Specific Ag, Serum: 20.5 ng/mL — ABNORMAL HIGH (ref 0.0–4.0)

## 2024-11-01 ENCOUNTER — Ambulatory Visit (INDEPENDENT_AMBULATORY_CARE_PROVIDER_SITE_OTHER): Admitting: Urology

## 2024-11-01 VITALS — BP 121/73 | HR 70 | Wt 247.0 lb

## 2024-11-01 DIAGNOSIS — R3912 Poor urinary stream: Secondary | ICD-10-CM | POA: Diagnosis not present

## 2024-11-01 DIAGNOSIS — R351 Nocturia: Secondary | ICD-10-CM | POA: Diagnosis not present

## 2024-11-01 DIAGNOSIS — R972 Elevated prostate specific antigen [PSA]: Secondary | ICD-10-CM | POA: Diagnosis not present

## 2024-11-01 DIAGNOSIS — R102 Pelvic and perineal pain unspecified side: Secondary | ICD-10-CM

## 2024-11-01 MED ORDER — SULFAMETHOXAZOLE-TRIMETHOPRIM 800-160 MG PO TABS: 1.0000 | ORAL_TABLET | Freq: Two times a day (BID) | ORAL | 0 refills | Status: AC

## 2024-11-01 NOTE — Patient Instructions (Addendum)
 Understanding PSA Screening  What is PSA Screening? PSA stands for Prostate-Specific Antigen. It is a protein made by the prostate gland.  PSA is made by normal prostate tissue, but can be elevated in patients with prostate cancer.  There are other factors that can cause an increased PSA besides prostate cancer including an enlarged prostate(BPH), recent ejaculation, infection(prostatitis), inflammation, recent illness or procedure.  A normal PSA level is less than 4 for men under age 4.  Why is PSA Screening Important? Prostate cancer is a common cancer in men, by finding prostate cancer early before patients have symptoms we can often cure this with either surgery or radiation.  Some prostate cancers grow very slowly and do not need any intervention and can be safely monitored(active surveillance).  Our goal is to find those more aggressive prostate cancers that if untreated would spread outside the prostate and cause symptoms or death.  If prostate cancer spreads outside the prostate, it cannot usually be cured, but multiple treatments are available to slow the growth of cancer and prolong life.  Who Should Get Tested? Most patients should start PSA testing around age 66 or 82, however high risk patients with a family history of prostate cancer, African American descent, patients with a strong family history of breast cancer should consider screening earlier starting at age 19. After age 21, the risks of screening start outweigh the benefits, and routine screening is not recommended after age 9.  This is because even if you develop prostate cancer in your 62s, 30s, or 90s it tends to grow so slowly that it would not cause symptoms or problems.  What Are the Benefits?    Early detection of prostate cancer More treatment options if cancer is found  Options if you have an elevated PSA: First, a confirmatory second PSA level will always be checked to confirm elevation, as false elevations are  common and we want to avoid any invasive testing if possible If you have 2 elevated PSA levels, options include:  PHI(prostate health index) score: (fancy PSA blood test), this can help determine your risk of prostate cancer if your PSA is mildly elevated between 4 and 10 before moving to more expensive/invasive testing Prostate MRI: Imaging test that can detect areas suspicious for prostate cancer, if MRI is completely normal can potentially avoid a prostate biopsy Prostate biopsy: 10 to 15-minute procedure performed in clinic, can be uncomfortable, 1 to 2% chance of serious bleeding or infection, best test to determine if prostate cancer is present but more invasive   What Are the Risks or Downsides?    Prostate biopsy can be uncomfortable with a small risk of bleeding or infection Some prostate cancers grow very slowly and might never cause problems, and detection may lead to unnecessary treatments Possible side effects from follow-up tests or treatments   Please contact Central Scheduling to set up your prostate MRI at [phone].  Prostate MRI Prep:  1- No ejaculation 48 hours prior to exam  2- No caffeine or carbonated beverages on day of the exam  3- Eat light diet evening prior and day of exam  4- Avoid eating 4 hours prior to exam  5- Fleets enema needs to be done 4 hours prior to exam -See below. Can be purchased at the drug store.

## 2024-11-01 NOTE — Progress Notes (Signed)
" ° °  11/01/2024 2:17 PM   Stephen Cochran [place] Feb 18, 1976 [id number]  Reason for visit: Follow up elevated PSA, new urinary symptoms  History: Elevated PSA values of 15 and 16.6 from September and October 2025, increased from prior value of 1.34 in 2020 Prostate biopsy November 2025 21 g prostate, benign tissue with inflammation but no evidence of malignancy  Imaging/labs: Prostate biopsy November 2025 21 g prostate, benign tissue with chronic inflammation but no evidence of malignancy Recent PSA continues to increase, now 20.5  Today: Reports about 2 weeks of new urinary symptoms with weak stream, pelvic pressure and discomfort, and nocturia 2 times at night which is all new  Plan:   Elevated PSA: Prostate biopsy November 2025 showing 21 g prostate with fairly significant inflammation throughout but otherwise benign.  PSA continues to increase now 20, with some new obstructive type urinary symptoms.  We discussed possible etiologies including a central prostate tumor, prostatitis, inflammation, other etiologies.  I recommended a prostate MRI for further evaluation.  I also recommended a trial of Bactrim  DS twice daily x 10 days for possible component of prostatitis especially with new urinary symptoms. Prostate MRI, call with results   [place] JAYSON Burnet, MD  [facility] [address], [address] [place], [place] [address] [phone]  "

## 2024-11-20 ENCOUNTER — Ambulatory Visit

## 2024-12-04 ENCOUNTER — Ambulatory Visit: Admitting: Dermatology

## 2024-12-12 ENCOUNTER — Ambulatory Visit
